# Patient Record
Sex: Male | Born: 1955 | Race: Black or African American | Hispanic: No | Marital: Single | Smoking: Never smoker
Health system: Southern US, Community
[De-identification: ages and names within clinical notes are randomized; demographics above are authoritative.]

## PROBLEM LIST (undated history)

## (undated) DIAGNOSIS — I10 Essential (primary) hypertension: Secondary | ICD-10-CM

## (undated) DIAGNOSIS — M6289 Other specified disorders of muscle: Secondary | ICD-10-CM

## (undated) DIAGNOSIS — F209 Schizophrenia, unspecified: Secondary | ICD-10-CM

## (undated) HISTORY — PX: NO PAST SURGERIES: SHX2092

---

## 2008-12-04 ENCOUNTER — Encounter: Admission: RE | Admit: 2008-12-04 | Discharge: 2008-12-04 | Payer: Self-pay | Admitting: Family Medicine

## 2008-12-04 ENCOUNTER — Encounter (INDEPENDENT_AMBULATORY_CARE_PROVIDER_SITE_OTHER): Payer: Self-pay | Admitting: *Deleted

## 2009-09-01 ENCOUNTER — Encounter: Admission: RE | Admit: 2009-09-01 | Discharge: 2009-09-01 | Payer: Self-pay | Admitting: Family Medicine

## 2010-10-30 ENCOUNTER — Emergency Department (HOSPITAL_COMMUNITY)
Admission: EM | Admit: 2010-10-30 | Discharge: 2010-10-30 | Payer: Self-pay | Source: Home / Self Care | Admitting: Emergency Medicine

## 2010-11-16 ENCOUNTER — Encounter: Payer: Self-pay | Admitting: Gastroenterology

## 2010-12-24 ENCOUNTER — Encounter: Payer: Self-pay | Admitting: Gastroenterology

## 2010-12-24 ENCOUNTER — Ambulatory Visit
Admission: RE | Admit: 2010-12-24 | Discharge: 2010-12-24 | Payer: Self-pay | Source: Home / Self Care | Attending: Gastroenterology | Admitting: Gastroenterology

## 2010-12-24 DIAGNOSIS — I10 Essential (primary) hypertension: Secondary | ICD-10-CM | POA: Insufficient documentation

## 2010-12-24 DIAGNOSIS — R141 Gas pain: Secondary | ICD-10-CM | POA: Insufficient documentation

## 2010-12-24 DIAGNOSIS — R142 Eructation: Secondary | ICD-10-CM

## 2010-12-24 DIAGNOSIS — K219 Gastro-esophageal reflux disease without esophagitis: Secondary | ICD-10-CM | POA: Insufficient documentation

## 2010-12-24 DIAGNOSIS — Z87442 Personal history of urinary calculi: Secondary | ICD-10-CM | POA: Insufficient documentation

## 2010-12-24 DIAGNOSIS — R011 Cardiac murmur, unspecified: Secondary | ICD-10-CM | POA: Insufficient documentation

## 2010-12-24 DIAGNOSIS — K649 Unspecified hemorrhoids: Secondary | ICD-10-CM | POA: Insufficient documentation

## 2010-12-24 DIAGNOSIS — K625 Hemorrhage of anus and rectum: Secondary | ICD-10-CM | POA: Insufficient documentation

## 2010-12-24 DIAGNOSIS — R143 Flatulence: Secondary | ICD-10-CM

## 2010-12-31 ENCOUNTER — Telehealth (INDEPENDENT_AMBULATORY_CARE_PROVIDER_SITE_OTHER): Payer: Self-pay | Admitting: *Deleted

## 2010-12-31 NOTE — Letter (Signed)
Summary: Results Letter  [facility]  [address] [place], [place] [address]   Phone: [phone]  Fax: [phone]        December 24, 2010 MRN: [id number]    [facility] [address] [place], [place]  [address]    Dear Mr. MCENERY,  It is my pleasure to have treated you recently as a new patient in my office. I appreciate your confidence and the opportunity to participate in your care.  Since I do have a busy inpatient endoscopy schedule and office schedule, my office hours vary weekly. I am, however, available for emergency calls everyday through my office. If I am not available for an urgent office appointment, another one of our gastroenterologist will be able to assist you.  My well-trained staff are prepared to help you at all times. For emergencies after office hours, a physician from our Gastroenterology section is always available through my 24 hour answering service  Once again I welcome you as a new patient and I look forward to a happy and healthy relationship             Sincerely,  Louis Meckel MD  This letter has been electronically signed by your physician.  Appended Document: Results Letter letter mailed

## 2010-12-31 NOTE — Letter (Signed)
Summary: [facility] Instructions  [facility]  [address] [place], [place] [address]   Phone: [phone]  Fax: [phone]       Roy Barber    November 08, 1956    MRN: [id number]        Procedure Day /Date:THURSDAY 01/07/2011     Arrival Time:3PM     Procedure Time:4PM     Location of Procedure:                    X  [facility] (4th Floor)                       PREPARATION FOR COLONOSCOPY WITH MOVIPREP   Starting 5 days prior to your procedure2/02/2011 do not eat nuts, seeds, popcorn, corn, beans, peas,  salads, or any raw vegetables.  Do not take any fiber supplements (e.g. Metamucil, Citrucel, and Benefiber).  THE DAY BEFORE YOUR PROCEDURE         DATE: 01/06/2011  DAY: WED  1.  Drink clear liquids the entire day-NO SOLID FOOD  2.  Do not drink anything colored red or purple.  Avoid juices with pulp.  No orange juice.  3.  Drink at least 64 oz. (8 glasses) of fluid/clear liquids during the day to prevent dehydration and help the prep work efficiently.  CLEAR LIQUIDS INCLUDE: Water Jello Ice Popsicles Tea (sugar ok, no milk/cream) Powdered fruit flavored drinks Coffee (sugar ok, no milk/cream) Gatorade Juice: apple, white grape, white cranberry  Lemonade Clear bullion, consomm, broth Carbonated beverages (any kind) Strained chicken noodle soup Hard Candy                             4.  In the morning, mix first dose of MoviPrep solution:    Empty 1 Pouch A and 1 Pouch B into the disposable container    Add lukewarm drinking water to the top line of the container. Mix to dissolve    Refrigerate (mixed solution should be used within 24 hrs)  5.  Begin drinking the prep at 5:00 p.m. The MoviPrep container is divided by 4 marks.   Every 15 minutes drink the solution down to the next mark (approximately 8 oz) until the full liter is complete.   6.  Follow completed prep with 16 oz of clear liquid of your choice (Nothing red or purple).   Continue to drink clear liquids until bedtime.  7.  Before going to bed, mix second dose of MoviPrep solution:    Empty 1 Pouch A and 1 Pouch B into the disposable container    Add lukewarm drinking water to the top line of the container. Mix to dissolve    Refrigerate  THE DAY OF YOUR PROCEDURE      DATE: 01/07/2011 ONG:EXBMWUXL  Beginning at 11a.m. (5 hours before procedure):         1. Every 15 minutes, drink the solution down to the next mark (approx 8 oz) until the full liter is complete.  2. Follow completed prep with 16 oz. of clear liquid of your choice.    3. You may drink clear liquids until 2PM  (2 HOURS BEFORE PROCEDURE).   MEDICATION INSTRUCTIONS  Unless otherwise instructed, you should take regular prescription medications with a small sip of water   as early as possible the morning of your procedure.  OTHER INSTRUCTIONS  You will need a responsible adult at least 56 years of age to accompany you and drive you home.   This person must remain in the waiting room during your procedure.  Wear loose fitting clothing that is easily removed.  Leave jewelry and other valuables at home.  However, you may wish to bring a book to read or  an iPod/MP3 player to listen to music as you wait for your procedure to start.  Remove all body piercing jewelry and leave at home.  Total time from sign-in until discharge is approximately 2-3 hours.  You should go home directly after your procedure and rest.  You can resume normal activities the  day after your procedure.  The day of your procedure you should not:   Drive   Make legal decisions   Operate machinery   Drink alcohol   Return to work  You will receive specific instructions about eating, activities and medications before you leave.    The above instructions have been reviewed and explained to me by   _______________________    I fully understand and can verbalize these instructions  _____________________________ Date _________

## 2010-12-31 NOTE — Letter (Signed)
Summary: New Patient letter  [facility]  [address] [place], [place] [address]   Phone: [phone]  Fax: [phone]       11/16/2010 MRN: [id number]  [facility] [address] [place], [place]  [address]  Dear Mr. LAGRANGE,  Welcome to the Gastroenterology Division at [facility].    You are scheduled to see Dr.  Arlyce Dice on 12-24-10 at 3:45pm on the 3rd floor at [facility], [address]  We ask that you try to arrive at our office 15 minutes prior to your appointment time to allow for check-in.  We would like you to complete the enclosed self-administered evaluation form prior to your visit and bring it with you on the day of your appointment.  We will review it with you.  Also, please bring a complete list of all your medications or, if you prefer, bring the medication bottles and we will list them.  Please bring your insurance card so that we may make a copy of it.  If your insurance requires a referral to see a specialist, please bring your referral form from your primary care physician.  Co-payments are due at the time of your visit and may be paid by cash, check or credit card.     Your office visit will consist of a consult with your physician (includes a physical exam), any laboratory testing he/she may order, scheduling of any necessary diagnostic testing (e.g. x-ray, ultrasound, CT-scan), and scheduling of a procedure (e.g. Endoscopy, Colonoscopy) if required.  Please allow enough time on your schedule to allow for any/all of these possibilities.    If you cannot keep your appointment, please call [phone] to cancel or reschedule prior to your appointment date.  This allows [place] the opportunity to schedule an appointment for another patient in need of care.  If you do not cancel or reschedule by 5 p.m. the business day prior to your appointment date, you will be charged a $50.00 late cancellation/no-show fee.    Thank you for choosing [facility] for your medical needs.  We appreciate the opportunity to care for you.  Please visit [place] at our website  to learn more about our practice.                     Sincerely,                                                             The Gastroenterology Division

## 2011-01-01 ENCOUNTER — Ambulatory Visit (HOSPITAL_COMMUNITY): Payer: MEDICARE | Attending: Gastroenterology

## 2011-01-01 DIAGNOSIS — R011 Cardiac murmur, unspecified: Secondary | ICD-10-CM | POA: Insufficient documentation

## 2011-01-01 DIAGNOSIS — I079 Rheumatic tricuspid valve disease, unspecified: Secondary | ICD-10-CM | POA: Insufficient documentation

## 2011-01-01 DIAGNOSIS — I10 Essential (primary) hypertension: Secondary | ICD-10-CM | POA: Insufficient documentation

## 2011-01-01 DIAGNOSIS — I059 Rheumatic mitral valve disease, unspecified: Secondary | ICD-10-CM | POA: Insufficient documentation

## 2011-01-04 ENCOUNTER — Telehealth: Payer: Self-pay | Admitting: Gastroenterology

## 2011-01-06 NOTE — Progress Notes (Signed)
Summary: Echo appt  Phone Note Outgoing Call Call back at [facility] Phone [phone]   Call placed by: Stanton Kidney, EMT-P,  December 31, 2010 2:07 PM Call placed to: Patient Action Taken: Phone Call Completed Summary of Call: Reminded patient to arrive min. 30 min prior to appt. Stanton Kidney, EMT-P  December 31, 2010 2:08 PM

## 2011-01-06 NOTE — Assessment & Plan Note (Signed)
Summary: BLEEDING HEMORROIDS/YF   History of Present Illness Visit Type: Initial Consult Primary GI MD: Melvia Heaps MD [facility] Primary Provider: Amalia Hailey, MD Requesting Provider: Amalia Hailey, MD Chief Complaint: Pt c/o GERD, bloating, hemorrhoids, and rectal bleeding from hemorrhoids but has slowed down since he used the suppositories History of Present Illness:   Roy Barber is a pleasant 55 year old Afro-American male referred at the request of Dr. Bruna Potter for evaluation of rectal bleeding.  On several occasions he saw small amounts of blood on the toilet tissue.  He denies change in bowel habits, rectal or abdominal pain.  He was treated with suppositories with resolution of bleeding.  He has no other GI complaints.   GI Review of Systems    Reports acid reflux, bloating, and  heartburn.      Denies abdominal pain, belching, chest pain, dysphagia with liquids, dysphagia with solids, loss of appetite, nausea, vomiting, vomiting blood, weight loss, and  weight gain.      Reports hemorrhoids and  rectal bleeding.     Denies anal fissure, black tarry stools, change in bowel habit, constipation, diarrhea, diverticulosis, fecal incontinence, heme positive stool, irritable bowel syndrome, jaundice, light color stool, liver problems, and  rectal pain.    Current Medications (verified): 1)  Cogentin 15mg  .... 1/2 Tablet By Mouth At Bedtime 2)  Clonidine Hcl 0.2 Mg Tabs (Clonidine Hcl) .... One Tablet By Mouth Two Times A Day 3)  Prolixin D 25mg /cc .... Injection Once A Month  Allergies (verified): No Known Drug Allergies  Past History:  Past Medical History: RENAL CALCULUS, HX OF (ICD-V13.01) ABDOMINAL BLOATING (ICD-787.3) GERD (ICD-530.81) HEMORRHOIDS (ICD-455.6) HYPERTENSION (ICD-401.9)  Past Surgical History: Unremarkable  Family History: Family History of Colon Cancer: ? Older Brother  Family History of Colon Polyps: ? Older Brother  Family History of Diabetes: Mother    Family History of Heart Disease: Mother   Social History: [place] [employer] [place] [employer] Single Childern Patient has never smoked.  Alcohol Use - no Daily Caffeine Use: 1 daily  Illicit Drug Use - no Smoking Status:  never Drug Use:  no  Review of Systems  The patient denies allergy/sinus, anemia, anxiety-new, arthritis/joint pain, back pain, blood in urine, breast changes/lumps, change in vision, confusion, cough, coughing up blood, depression-new, fainting, fatigue, fever, headaches-new, hearing problems, heart murmur, heart rhythm changes, itching, menstrual pain, muscle pains/cramps, night sweats, nosebleeds, pregnancy symptoms, shortness of breath, skin rash, sleeping problems, sore throat, swelling of feet/legs, swollen lymph glands, thirst - excessive , urination - excessive , urination changes/pain, urine leakage, vision changes, and voice change.    Vital Signs:  Patient profile:   55 year old male Height:      74 inches Weight:      225 pounds BMI:     28.99 BSA:     2.29 Pulse rate:   88 / minute Pulse rhythm:   regular BP sitting:   142 / 86  (left arm) Cuff size:   regular  Vitals Entered By: Ok Anis CMA (December 24, 2010 3:04 PM)  Physical Exam  Additional Exam:  On physical exam he is a well-developed well-nourished male  skin: anicteric HEENT: normocephalic; PEERLA; no nasal or pharyngeal abnormalities neck: supple nodes: no cervical lymphadenopathy chest: clear to ausculatation and percussion heart: no  gallops, or rubs; there is a 2-3/6 early systolic murmur heard bestalong  the left sternal border abd: soft, nontender; BS normoactive; no abdominal masses, tenderness, organomegaly rectal: no masses  ext: no cynanosis, clubbing, edema skeletal: no deformities neuro: oriented x 3; no focal abnormalities    Impression & Recommendations:  Problem # 1:  HEMORRHAGE OF RECTUM AND ANUS (ICD-569.3)  Limited rectal bleeding is most likely due to  hemorrhoids.  A more proximal colonic bleeding source should be ruled out.  Recommendations #1 colonoscopy  Risks, alternatives, and complications of the procedure, including bleeding, perforation, and possible need for surgery, were explained to the patient.  Patient's questions were answered.  Orders: Colonoscopy (Colon)  Problem # 2:  CARDIAC MURMUR, SYSTOLIC (ICD-785.2) Plan echocardiogram  Patient Instructions: 1)  Copy sent to : Amalia Hailey, MD 2)  Your Colonoscopy is scheduled on 01/07/2011 at 4pm 3)  You can pick up your MoviPrep today 4)  Your Echocardiogram is scheduled for 01/01/2011 at 7:30am at [facility] 5)  Colonoscopy and Flexible Sigmoidoscopy brochure given.  6)  Conscious Sedation brochure given.  7)  The medication list was reviewed and reconciled.  All changed / newly prescribed medications were explained.  A complete medication list was provided to the patient / caregiver. Prescriptions: MOVIPREP 100 GM  SOLR (PEG-KCL-NACL-NASULF-NA ASC-C) As per prep instructions.  #1 x 0   Entered by:   Merri Ray CMA (AAMA)   Authorized by:   Louis Meckel MD   Signed by:   Merri Ray CMA (AAMA) on 12/24/2010   Method used:   Electronically to        [facility] (retail)       [address]       [place], [place]  [address]       Ph: [phone]       Fax: [phone]   RxID:   [id number]

## 2011-01-07 ENCOUNTER — Other Ambulatory Visit: Payer: MEDICARE | Admitting: Gastroenterology

## 2011-01-07 ENCOUNTER — Encounter: Payer: Self-pay | Admitting: Gastroenterology

## 2011-01-07 ENCOUNTER — Other Ambulatory Visit: Payer: Self-pay | Admitting: Gastroenterology

## 2011-01-07 ENCOUNTER — Other Ambulatory Visit (AMBULATORY_SURGERY_CENTER): Payer: MEDICARE | Admitting: Gastroenterology

## 2011-01-07 DIAGNOSIS — K5730 Diverticulosis of large intestine without perforation or abscess without bleeding: Secondary | ICD-10-CM

## 2011-01-07 DIAGNOSIS — D126 Benign neoplasm of colon, unspecified: Secondary | ICD-10-CM

## 2011-01-07 DIAGNOSIS — K648 Other hemorrhoids: Secondary | ICD-10-CM

## 2011-01-07 DIAGNOSIS — K625 Hemorrhage of anus and rectum: Secondary | ICD-10-CM

## 2011-01-12 ENCOUNTER — Encounter: Payer: Self-pay | Admitting: Gastroenterology

## 2011-01-14 NOTE — Procedures (Addendum)
Summary: Colonoscopy  Patient: Roy Barber Note: All result statuses are Final unless otherwise noted.  Tests: (1) Colonoscopy (COL)   COL Colonoscopy           DONE     [facility]     [address]     [place], [place]  [address]           COLONOSCOPY PROCEDURE REPORT           PATIENT:  Roy, Barber  MR#:  [id number]     BIRTHDATE:  1956/04/24, 54 yrs. old  GENDER:  male           ENDOSCOPIST:  Barbette Hair. Arlyce Dice, MD     Referred by:  Clyda Greener, M.D.           PROCEDURE DATE:  01/07/2011     PROCEDURE:  Colonoscopy with snare polypectomy     ASA CLASS:  Class I     INDICATIONS:  1) rectal bleeding           MEDICATIONS:   Fentanyl 100 mcg IV, Versed 10 mg IV           DESCRIPTION OF PROCEDURE:   After the risks benefits and     alternatives of the procedure were thoroughly explained, informed     consent was obtained.  Digital rectal exam was performed and     revealed no abnormalities.   The LB 180AL [id number] endoscope was     introduced through the anus and advanced to the cecum, which was     identified by both the appendix and ileocecal valve, without     limitations.  The quality of the prep was excellent, using     MoviPrep.  The instrument was then slowly withdrawn as the colon     was fully examined.     <<PROCEDUREIMAGES>>           FINDINGS:  A sessile polyp was found in the descending colon. It     was 3 mm in size. Polyp was snared without cautery. Retrieval was     successful (see image8). snare polyp  Scattered diverticula were     found in the ascending colon (see image1).  Internal Hemorrhoids     were found (see image16).  This was otherwise a normal examination     of the colon (see image2, image4, image5, and image15).     Retroflexed views in the rectum revealed no abnormalities.    The     time to cecum =  2.0  minutes. The scope was then withdrawn (time     =  8.25  min) from the patient and the procedure completed.        COMPLICATIONS:  None           ENDOSCOPIC IMPRESSION:     1) 3 mm sessile polyp in the descending colon     2) Diverticula, scattered in the ascending colon     3) Internal hemorrhoids     4) Otherwise normal examination           Limited rectal bleeding secondary to hemorrhoids           RECOMMENDATIONS:     1) Anusol HC supp. prn     2) If the polyp(s) removed today are proven to be adenomatous     (pre-cancerous) polyps, you will need a repeat colonoscopy in 5     years. Otherwise you should continue  to follow colorectal cancer     screening guidelines for "routine risk" patients with colonoscopy     in 10 years.           REPEAT EXAM:   You will receive a letter from Dr. Arlyce Dice in 1-2     weeks, after reviewing the final pathology, with followup     recommendations.           ______________________________     Barbette Hair Arlyce Dice, MD           CC:           n.     eSIGNED:   Barbette Hair. Haasini Patnaude at 01/07/2011 03:34 PM           Phineas Inches, [phone]  Note: An exclamation mark (!) indicates a result that was not dispersed into the flowsheet. Document Creation Date: 01/07/2011 3:35 PM _______________________________________________________________________  (1) Order result status: Final Collection or observation date-time: 01/07/2011 15:26 Requested date-time:  Receipt date-time:  Reported date-time:  Referring Physician:   Ordering Physician: Melvia Heaps [phone]) Specimen Source:  Source: Launa Grill Order Number: [id number] Lab site:   Appended Document: Colonoscopy     Procedures Next Due Date:    Colonoscopy: 12/2015

## 2011-01-14 NOTE — Miscellaneous (Signed)
Summary: rx for anusol  Clinical Lists Changes  Medications: Added new medication of ANUSOL-HC 25 MG SUPP (HYDROCORTISONE ACETATE) As needed at bedtime for hemorroidal pain - Signed Rx of ANUSOL-HC 25 MG SUPP (HYDROCORTISONE ACETATE) As needed at bedtime for hemorroidal pain;  #12 x 0;  Signed;  Entered by: Weston Brass RN;  Authorized by: Louis Meckel MD;  Method used: Electronically to [facility] [address] [place], [place], [place]  [address], Ph: [phone], Fax: [phone]    Prescriptions: ANUSOL-HC 25 MG SUPP (HYDROCORTISONE ACETATE) As needed at bedtime for hemorroidal pain  #12 x 0   Entered by:   Weston Brass RN   Authorized by:   Louis Meckel MD   Signed by:   Weston Brass RN on 01/07/2011   Method used:   Electronically to        [facility] (retail)       [address]       [place], [place]  [address]       Ph: [phone]       Fax: [phone]   RxID:   [id number]

## 2011-01-14 NOTE — Progress Notes (Signed)
Summary: Results  Phone Note Call from Patient Call back at Home Phone [phone]   Caller: Patient Call For: Dr. Arlyce Dice Reason for Call: Talk to Nurse Summary of Call: Pt is calling to check on results of procedure he had Initial call taken by: [place] Johnson,  January 04, 2011 12:25 PM  Follow-up for Phone Call        Per Dr. Arlyce Dice the 2D Echo results showed no obvious abnormalities. Patient aware. Follow-up by: Selinda Michaels RN,  January 04, 2011 12:52 PM

## 2011-01-20 NOTE — Letter (Signed)
Summary: Patient Notice- Polyp Results  [facility]  [address] [place], [place] [address]   Phone: [phone]  Fax: [phone]        January 12, 2011 MRN: [id number]    Roy Barber [address] [place], [place]  [address]    Dear [name],  I am pleased to inform you that the colon polyp(s) removed during your recent colonoscopy was (were) found to be benign (no cancer detected) upon pathologic examination.  I recommend you have a repeat colonoscopy examination in 5_ years to look for recurrent polyps, as having colon polyps increases your risk for having recurrent polyps or even colon cancer in the future.  Should you develop new or worsening symptoms of abdominal pain, bowel habit changes or bleeding from the rectum or bowels, please schedule an evaluation with either your primary care physician or with me.  Additional information/recommendations:  __ No further action with gastroenterology is needed at this time. Please      follow-up with your primary care physician for your other healthcare      needs.  __ Please call [phone] to schedule a return visit to review your      situation.  __ Please keep your follow-up visit as already scheduled.  _x_ Continue treatment plan as outlined the day of your exam.  Please call us if you are having persistent problems or have questions about your condition that have not been fully answered at this time.  Sincerely,  Louis Meckel MD  This letter has been electronically signed by your physician.  Appended Document: Patient Notice- Polyp Results letter mailed

## 2011-02-09 LAB — POCT I-STAT, CHEM 8
Chloride: 107 mEq/L (ref 96–112)
Creatinine, Ser: 1.1 mg/dL (ref 0.4–1.5)
Glucose, Bld: 122 mg/dL — ABNORMAL HIGH (ref 70–99)
Sodium: 142 mEq/L (ref 135–145)
TCO2: 27 mmol/L (ref 0–100)

## 2012-06-13 ENCOUNTER — Emergency Department (HOSPITAL_COMMUNITY)
Admission: EM | Admit: 2012-06-13 | Discharge: 2012-06-13 | Disposition: A | Discharge status: Home / Self Care | Payer: PRIVATE HEALTH INSURANCE | Attending: Emergency Medicine | Admitting: Emergency Medicine

## 2012-06-13 ENCOUNTER — Emergency Department (HOSPITAL_COMMUNITY): Payer: PRIVATE HEALTH INSURANCE

## 2012-06-13 ENCOUNTER — Encounter (HOSPITAL_COMMUNITY): Payer: Self-pay

## 2012-06-13 DIAGNOSIS — I10 Essential (primary) hypertension: Secondary | ICD-10-CM | POA: Insufficient documentation

## 2012-06-13 DIAGNOSIS — S39011A Strain of muscle, fascia and tendon of abdomen, initial encounter: Secondary | ICD-10-CM

## 2012-06-13 DIAGNOSIS — R109 Unspecified abdominal pain: Secondary | ICD-10-CM

## 2012-06-13 DIAGNOSIS — IMO0002 Reserved for concepts with insufficient information to code with codable children: Secondary | ICD-10-CM | POA: Insufficient documentation

## 2012-06-13 DIAGNOSIS — X58XXXA Exposure to other specified factors, initial encounter: Secondary | ICD-10-CM | POA: Insufficient documentation

## 2012-06-13 HISTORY — DX: Other specified disorders of muscle: M62.89

## 2012-06-13 HISTORY — DX: Essential (primary) hypertension: I10

## 2012-06-13 LAB — URINALYSIS, ROUTINE W REFLEX MICROSCOPIC
Hgb urine dipstick: NEGATIVE
Leukocytes, UA: NEGATIVE
Protein, ur: 30 mg/dL — AB
Specific Gravity, Urine: 1.017 (ref 1.005–1.030)
Urobilinogen, UA: 1 mg/dL (ref 0.0–1.0)

## 2012-06-13 LAB — COMPREHENSIVE METABOLIC PANEL
ALT: 22 U/L (ref 0–53)
AST: 27 U/L (ref 0–37)
Chloride: 102 mEq/L (ref 96–112)
GFR calc Af Amer: 88 mL/min — ABNORMAL LOW (ref >90)
Potassium: 3.9 mEq/L (ref 3.5–5.1)
Sodium: 141 mEq/L (ref 135–145)
Total Protein: 8.2 g/dL (ref 6.0–8.3)

## 2012-06-13 LAB — CBC WITH DIFFERENTIAL/PLATELET
Eosinophils Absolute: 0.2 10*3/uL (ref 0.0–0.7)
MCH: 28.9 pg (ref 26.0–34.0)
MCHC: 34.1 g/dL (ref 30.0–36.0)
MCV: 84.8 fL (ref 78.0–100.0)
Monocytes Relative: 9 % (ref 3–12)
RBC: 4.88 MIL/uL (ref 4.22–5.81)
RDW: 13.6 % (ref 11.5–15.5)
WBC: 6 10*3/uL (ref 4.0–10.5)

## 2012-06-13 LAB — URINE MICROSCOPIC-ADD ON

## 2012-06-13 MED ORDER — SODIUM CHLORIDE 0.9 % IV BOLUS (SEPSIS)
1000.0000 mL | Freq: Once | INTRAVENOUS | Status: AC
  Administered 2012-06-13: 1000 mL via INTRAVENOUS

## 2012-06-13 MED ORDER — KETOROLAC TROMETHAMINE 30 MG/ML IJ SOLN
30.0000 mg | Freq: Once | INTRAMUSCULAR | Status: AC
  Administered 2012-06-13: 30 mg via INTRAVENOUS
  Filled 2012-06-13: qty 1

## 2012-06-13 NOTE — ED Notes (Signed)
Pt has had right sided abdominal pain that radiates to his right flank x1 week. Pt denies SOB, nausea,vomitng or diarrhea.

## 2012-06-13 NOTE — ED Notes (Signed)
Please call pt sister, jennifer on [place], [phone]

## 2012-06-13 NOTE — ED Provider Notes (Signed)
History     CSN: [id number]  Arrival date & time 06/13/12  1652   First MD Initiated Contact with Patient 06/13/12 1937      Chief Complaint  Patient presents with  . Right sided pain     (Consider location/radiation/quality/duration/timing/severity/associated sxs/prior treatment) Patient is a 56 y.o. male presenting with flank pain. The history is provided by the patient.  Flank Pain This is a new problem. The current episode started in the past 7 days. The problem occurs constantly. The problem has been waxing and waning. Associated symptoms include abdominal pain. Pertinent negatives include no arthralgias, change in bowel habit, chest pain, chills, coughing, fatigue, fever, headaches, myalgias, nausea, rash, vomiting or weakness. The symptoms are aggravated by twisting (sitting up). He has tried acetaminophen for the symptoms. The treatment provided mild relief.    Past Medical History  Diagnosis Date  . Hypertension   . Muscle stiffness     No past surgical history on file.  History reviewed. No pertinent family history.  History  Substance Use Topics  . Smoking status: Never Smoker   . Smokeless tobacco: Not on file  . Alcohol Use: No      Review of Systems  Unable to perform ROS Constitutional: Negative for fever, chills and fatigue.  Respiratory: Negative for cough and shortness of breath.   Cardiovascular: Negative for chest pain and palpitations.  Gastrointestinal: Positive for abdominal pain. Negative for nausea, vomiting, diarrhea, constipation, abdominal distention and change in bowel habit.  Genitourinary: Positive for flank pain. Negative for dysuria, hematuria and difficulty urinating.  Musculoskeletal: Negative for myalgias and arthralgias.  Skin: Negative for color change and rash.  Neurological: Negative for weakness, light-headedness and headaches.  All other systems reviewed and are negative.    Allergies  Review of patient's allergies  indicates no known allergies.  Home Medications  No current outpatient prescriptions on file.  BP 190/91  Pulse 92  Temp 97.7 F (36.5 C)  Resp 18  SpO2 100%  Physical Exam  Nursing note and vitals reviewed. Constitutional: He is oriented to person, place, and time. He appears well-developed and well-nourished.  HENT:  Head: Normocephalic and atraumatic.  Eyes: EOM are normal. Pupils are equal, round, and reactive to light.  Cardiovascular: Normal rate and regular rhythm.   Pulmonary/Chest: Effort normal and breath sounds normal. No respiratory distress.  Abdominal: Soft. Bowel sounds are normal. He exhibits no distension. There is tenderness. There is no rebound and no guarding. No hernia.    Neurological: He is alert and oriented to person, place, and time.  Skin: Skin is warm and dry.  Psychiatric: He has a normal mood and affect.    ED Course  Procedures (including critical care time)  Labs Reviewed  COMPREHENSIVE METABOLIC PANEL - Abnormal; Notable for the following:    Glucose, Bld 106 (*)     GFR calc non Af Amer 76 (*)     GFR calc Af Amer 88 (*)     All other components within normal limits  URINALYSIS, ROUTINE W REFLEX MICROSCOPIC - Abnormal; Notable for the following:    Protein, ur 30 (*)     All other components within normal limits  CBC WITH DIFFERENTIAL  URINE MICROSCOPIC-ADD ON   Ct Abdomen Pelvis Wo Contrast  06/13/2012  *RADIOLOGY REPORT*  Clinical Data: Right-sided abdominal/flank pain for past week.  CT ABDOMEN AND PELVIS WITHOUT CONTRAST  Technique:  Multidetector CT imaging of the abdomen and pelvis was performed following the  standard protocol without intravenous contrast.  Comparison: 12/04/2008  Findings: No renal or ureteral calculi or findings to suggest renal collecting system obstruction.   Slightly lobulated contour of the right kidney which contains a 1 cm low density structure inferiorly which was noted previously and too small to adequately  characterize.  No extraluminal bowel inflammatory process, free fluid or free air. Specifically, no inflammation surrounds the appendix.  Fatty liver.  Evaluation of solid abdominal viscera is limited by lack of IV contrast.  Taking this limitation into account no focal hepatic, splenic, pancreatic or adrenal mass.  No calcified gallstones.  Coronary artery calcifications may be present.  There are atherosclerotic type changes with calcification involving the aorta and iliac arteries.  No abdominal aortic aneurysm.  No bony destructive lesion.  Mild degenerative changes lumbar spine.  No adenopathy.  No obvious bladder mass detected on this unenhanced exam.  Prostate gland slightly prominent.  Clinical correlation recommended.  IMPRESSION: No renal or ureteral calculi or findings to suggest renal collecting system obstruction.   Slightly lobulated contour of the right kidney which contains a 1 cm low density structure inferiorly which was noted previously and too small to adequately characterize.  No extraluminal bowel inflammatory process, free fluid or free air. Specifically, no inflammation surrounds the appendix.  Fatty liver.  Coronary artery calcifications may be present.  There are atherosclerotic type changes with calcification involving the aorta and iliac arteries.  No abdominal aortic aneurysm.  Prostate gland slightly prominent.  Clinical correlation recommended.  Original Report Authenticated By: Fuller Canada, M.D.     1. Right sided abdominal pain   2. Strain of abdominal muscle       MDM  This is a 56 year old man who presents today with right-sided abdominal pain for a week. He states he first noticed it when he was sitting up to get out bed, and has been feeling nearly continuously since then. He states it waxes and wanes in intensity, and he did have some relief with Tylenol. He says it hurts worse when he is going from a laying to seated position, but when he is at rest, walking  around or in the seated position it hurts less. He denies any fevers, nausea, vomiting, changes in bowels, change in urine, or any other complaints. He states that he has previously had kidney stones that he had to have removed, but the pain felt nothing like this. He has not previously had an abdominal surgeries. On exam he does have some mild tenderness to the right side of his abdomen, and the pain gets worse with activation his abdominal muscles. The patient does note that he does a lot of heavy lifting at work, and shortly before discharge was at a reunion where he was carrying around a heavy cooler full of drinks. Patient's symptoms are most likely due to musculoskeletal pain, however given his history of kidney stones requiring intervention the location of the pain, we'll get a CT scan to evaluate for possible stone.  CT scan was negative. Shows moderate improvement of his pain at this point in time. Discussed with the patient treatment at home, followup with regular Dr., indications for return. The patient expressed understanding with this plan.        Theotis Burrow, MD 06/14/12 [phone]

## 2012-06-16 NOTE — ED Provider Notes (Signed)
I saw and evaluated the patient, reviewed the resident's note and I agree with the findings and plan. Pt with right flank/rlq pain. Tenderness on exam. Ct.   Suzi Roots, MD 06/16/12 [phone]

## 2013-10-12 ENCOUNTER — Encounter (HOSPITAL_COMMUNITY): Payer: Self-pay | Admitting: Emergency Medicine

## 2013-10-12 ENCOUNTER — Emergency Department (HOSPITAL_COMMUNITY)
Admission: EM | Admit: 2013-10-12 | Discharge: 2013-10-12 | Disposition: A | Discharge status: Home / Self Care | Payer: PRIVATE HEALTH INSURANCE | Attending: Emergency Medicine | Admitting: Emergency Medicine

## 2013-10-12 ENCOUNTER — Other Ambulatory Visit: Payer: Self-pay

## 2013-10-12 ENCOUNTER — Emergency Department (HOSPITAL_COMMUNITY): Payer: PRIVATE HEALTH INSURANCE

## 2013-10-12 DIAGNOSIS — I10 Essential (primary) hypertension: Secondary | ICD-10-CM | POA: Insufficient documentation

## 2013-10-12 DIAGNOSIS — R109 Unspecified abdominal pain: Secondary | ICD-10-CM

## 2013-10-12 DIAGNOSIS — R110 Nausea: Secondary | ICD-10-CM | POA: Insufficient documentation

## 2013-10-12 DIAGNOSIS — R1013 Epigastric pain: Secondary | ICD-10-CM | POA: Insufficient documentation

## 2013-10-12 DIAGNOSIS — R197 Diarrhea, unspecified: Secondary | ICD-10-CM | POA: Insufficient documentation

## 2013-10-12 DIAGNOSIS — R079 Chest pain, unspecified: Secondary | ICD-10-CM | POA: Insufficient documentation

## 2013-10-12 DIAGNOSIS — Z79899 Other long term (current) drug therapy: Secondary | ICD-10-CM | POA: Insufficient documentation

## 2013-10-12 DIAGNOSIS — F209 Schizophrenia, unspecified: Secondary | ICD-10-CM | POA: Insufficient documentation

## 2013-10-12 HISTORY — DX: Schizophrenia, unspecified: F20.9

## 2013-10-12 LAB — CBC
Hemoglobin: 14.2 g/dL (ref 13.0–17.0)
MCH: 29.6 pg (ref 26.0–34.0)
MCHC: 35 g/dL (ref 30.0–36.0)
MCV: 84.6 fL (ref 78.0–100.0)
Platelets: 327 10*3/uL (ref 150–400)
RDW: 13.3 % (ref 11.5–15.5)
WBC: 8 10*3/uL (ref 4.0–10.5)

## 2013-10-12 LAB — POCT I-STAT TROPONIN I: Troponin i, poc: 0 ng/mL (ref 0.00–0.08)

## 2013-10-12 LAB — LIPASE, BLOOD: Lipase: 46 U/L (ref 11–59)

## 2013-10-12 LAB — BASIC METABOLIC PANEL
Calcium: 10 mg/dL (ref 8.4–10.5)
Chloride: 103 mEq/L (ref 96–112)
Creatinine, Ser: 1.21 mg/dL (ref 0.50–1.35)
Glucose, Bld: 107 mg/dL — ABNORMAL HIGH (ref 70–99)
Potassium: 3.6 mEq/L (ref 3.5–5.1)

## 2013-10-12 MED ORDER — OMEPRAZOLE 20 MG PO CPDR: 20.0000 mg | DELAYED_RELEASE_CAPSULE | Freq: Every day | ORAL | Status: DC

## 2013-10-12 NOTE — ED Notes (Signed)
Patient continuously coming up to nursing station asking about discharge status. Informed patient that we are still waiting for Troponin result and asked patient to please wait in his room, and I would bring discharge form when dispo is set. Patient understanding, and returned to room.

## 2013-10-12 NOTE — ED Provider Notes (Signed)
CSN: [id number]     Arrival date & time 10/12/13  1850 History   First MD Initiated Contact with Patient 10/12/13 1900     Chief Complaint  Patient presents with  . Chest Pain  . Abdominal Pain   (Consider location/radiation/quality/duration/timing/severity/associated sxs/prior Treatment) HPI Patient presents with epigastric pain, nausea. Symptoms began in the hours prior to arrival, but have stopped, spontaneously, prior to my evaluation. Patient was in his usual state of health prior to the onset of symptoms.  Also is gradual.  Pain is focally about the epigastrium, nonradiating, sore there was associated nausea, no vomiting. No recent incontinence. After the onset of symptoms the patient had one episode of diarrhea. No fever, no dyspnea, no cough. Patient denies cardiac history, notes psychiatric issues.  Past Medical History  Diagnosis Date  . Hypertension   . Muscle stiffness   . Schizophrenia    History reviewed. No pertinent past surgical history. No family history on file. History  Substance Use Topics  . Smoking status: Never Smoker   . Smokeless tobacco: Not on file  . Alcohol Use: No    Review of Systems  Constitutional:       Per HPI, otherwise negative  HENT:       Per HPI, otherwise negative  Respiratory:       Per HPI, otherwise negative  Cardiovascular:       Per HPI, otherwise negative  Gastrointestinal: Positive for nausea, abdominal pain and diarrhea. Negative for vomiting.  Endocrine:       Negative aside from HPI  Genitourinary:       Neg aside from HPI   Musculoskeletal:       Per HPI, otherwise negative  Skin: Negative.   Neurological: Negative for syncope.    Allergies  Review of patient's allergies indicates no known allergies.  Home Medications   Current Outpatient Rx  Name  Route  Sig  Dispense  Refill  . benztropine (COGENTIN) 0.5 MG tablet   Oral   Take 0.5 mg by mouth every evening.         . fluPHENAZine (PROLIXIN) 1 MG  tablet   Oral   Take 3 mg by mouth at bedtime.          BP 218/101  Pulse 107  Temp(Src) 98.2 F (36.8 C) (Oral)  Resp 16  Ht 6\' 2"  (1.88 m)  Wt 220 lb (99.791 kg)  BMI 28.23 kg/m2  SpO2 100% Physical Exam  Nursing note and vitals reviewed. Constitutional: He is oriented to person, place, and time. He appears well-developed. No distress.  HENT:  Head: Normocephalic and atraumatic.  Eyes: Conjunctivae and EOM are normal.  Cardiovascular: Normal rate and regular rhythm.   Pulmonary/Chest: Effort normal. No stridor. No respiratory distress.  Abdominal: He exhibits no distension. There is no tenderness.  Musculoskeletal: He exhibits no edema.  Neurological: He is alert and oriented to person, place, and time.  Skin: Skin is warm and dry.  Psychiatric: He has a normal mood and affect.    ED Course  Procedures (including critical care time) Labs Review Labs Reviewed  CBC  BASIC METABOLIC PANEL  LIPASE, BLOOD  POCT I-STAT TROPONIN I   Imaging Review No results found.  EKG Interpretation   None      Cardiac 95 Central normal Pulse ox which 100% remainder normal  EKG has sinus tachycardia, rate 109, T wave inversions laterally, abnormal   8:31 PM Patient ambulatory, in no distress.  He denies  no complaints.  Repeat troponin will be sent  10:41 PM Patient ambulatory, with no complaints, and requesting discharge.   Update: Second troponin is negative. MDM  No diagnosis found. This patient presents after an episode of epigastric discomfort.  Patient has multiple psychiatric issues, as well as hypertension, but no documented cardiac history.  Patient is notably hypertensive on arrival, but had no active complaints.  Patient's blood pressure improved while here, and he had no new complaints throughout.  With serial negative troponins, nonischemic EKG, and his denial of any ongoing complaints, there is low suspicion for ongoing coronary ischemia.    Gerhard Munch, MD 10/12/13 [phone]

## 2013-10-12 NOTE — ED Notes (Addendum)
Pt is c/o of epigastric and chest pain ever since the nurse at [facility] gave him the pills instead of giving him the shot like she normally does.  BP 218/101.

## 2014-06-21 IMAGING — CR DG CHEST 2V
3 series · 3 of 3 positions shown · non-contrast
Comparison: 09/01/2009

CLINICAL DATA: Chest pain.  Hypertension.

EXAM:
CHEST  2 VIEW

[w chest pa]
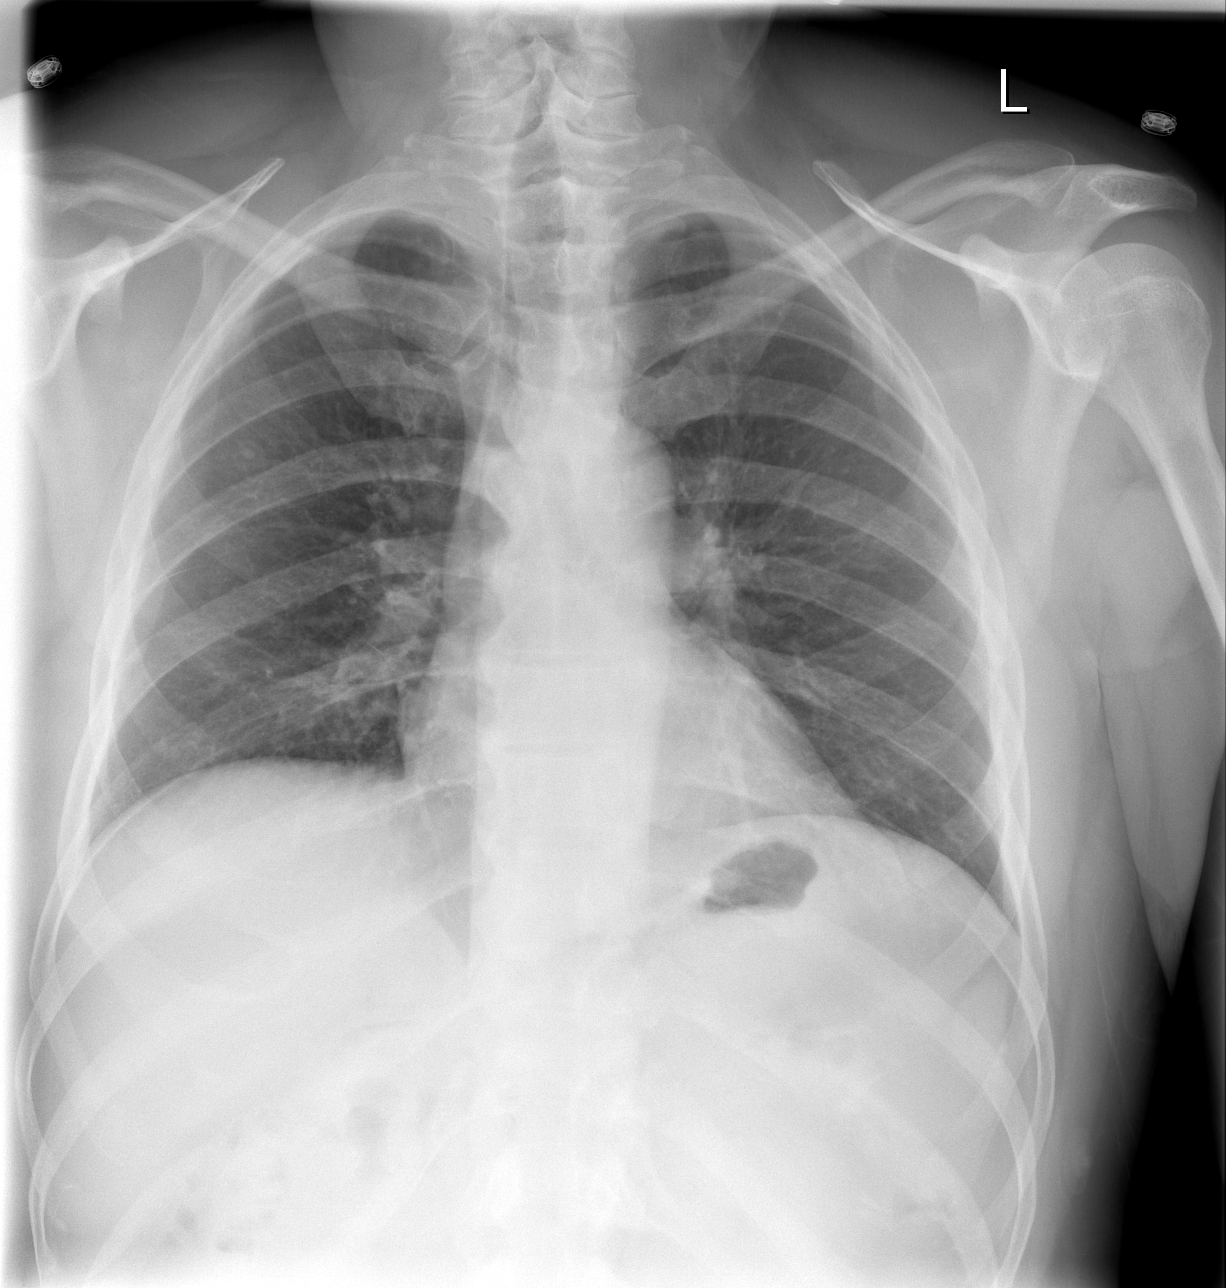

[w chest lat (1 of 2)]
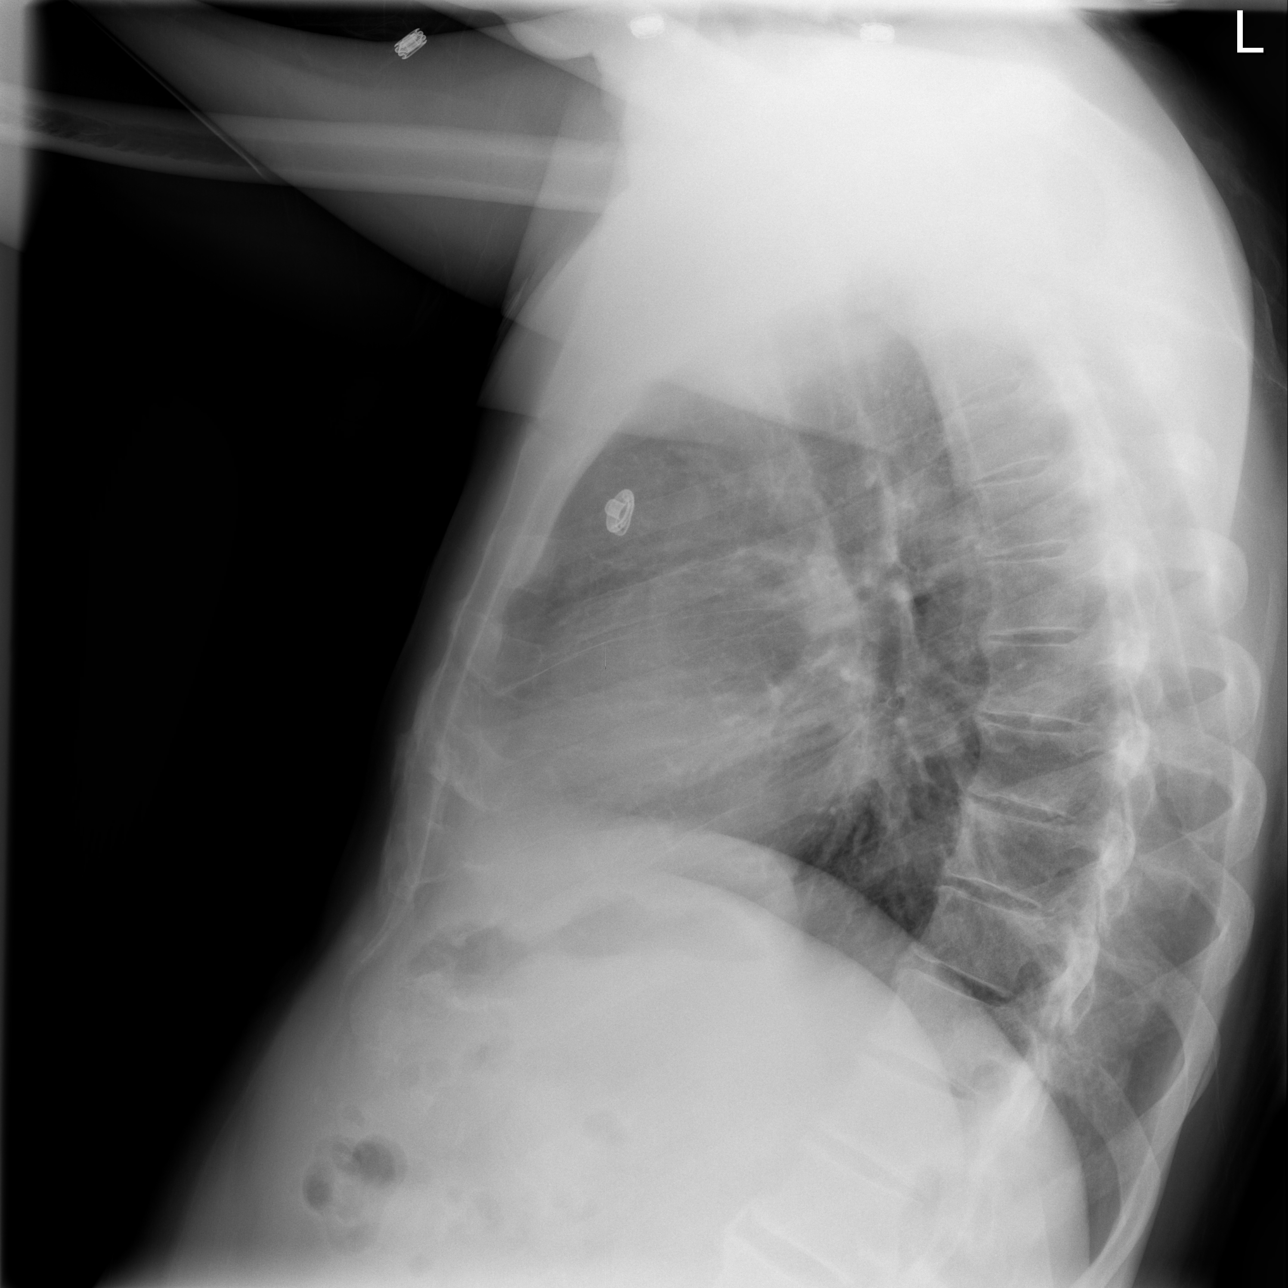

[w chest lat (2 of 2)]
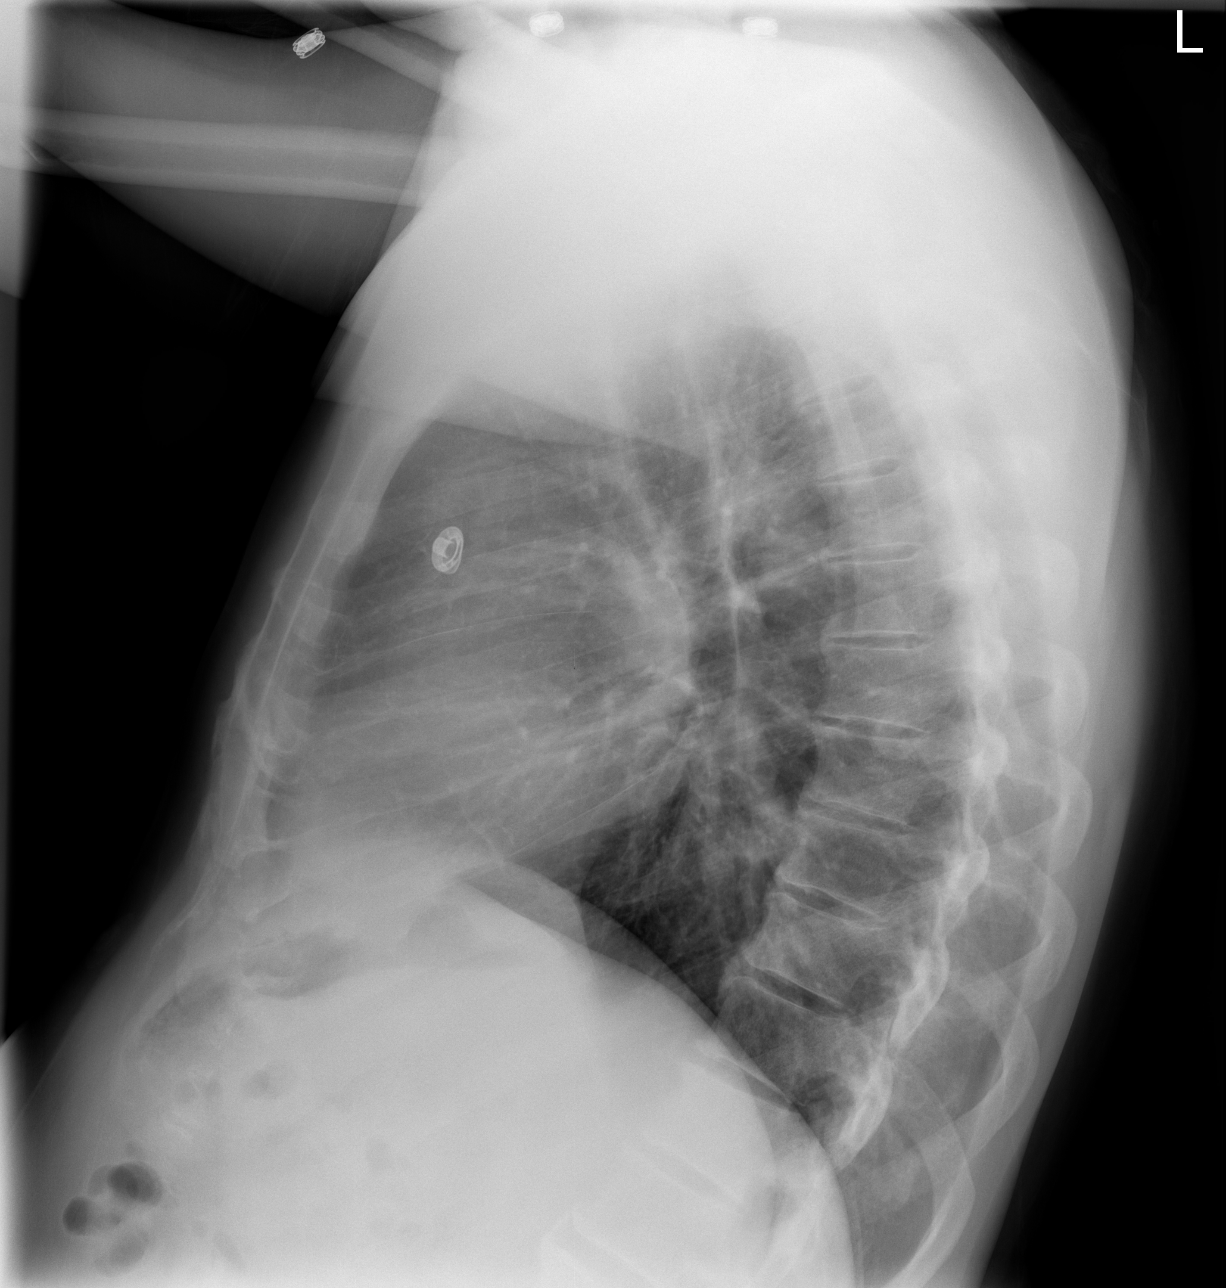

[3 of 3 positions shown; findings below may reference images not displayed]

FINDINGS: The heart size and mediastinal contours are within normal limits.
Both lungs are clear. The visualized skeletal structures are
unremarkable.
IMPRESSION: No active cardiopulmonary disease.

## 2014-07-24 ENCOUNTER — Observation Stay (HOSPITAL_COMMUNITY)
Admission: EM | Admit: 2014-07-24 | Discharge: 2014-07-25 | Disposition: A | Discharge status: Home / Self Care | Payer: PRIVATE HEALTH INSURANCE | Attending: Internal Medicine | Admitting: Internal Medicine

## 2014-07-24 ENCOUNTER — Emergency Department (HOSPITAL_COMMUNITY): Payer: PRIVATE HEALTH INSURANCE

## 2014-07-24 ENCOUNTER — Encounter (HOSPITAL_COMMUNITY): Payer: Self-pay | Admitting: Emergency Medicine

## 2014-07-24 DIAGNOSIS — N179 Acute kidney failure, unspecified: Secondary | ICD-10-CM | POA: Diagnosis not present

## 2014-07-24 DIAGNOSIS — I129 Hypertensive chronic kidney disease with stage 1 through stage 4 chronic kidney disease, or unspecified chronic kidney disease: Secondary | ICD-10-CM | POA: Insufficient documentation

## 2014-07-24 DIAGNOSIS — I951 Orthostatic hypotension: Secondary | ICD-10-CM | POA: Diagnosis not present

## 2014-07-24 DIAGNOSIS — R55 Syncope and collapse: Secondary | ICD-10-CM | POA: Diagnosis not present

## 2014-07-24 DIAGNOSIS — R9431 Abnormal electrocardiogram [ECG] [EKG]: Secondary | ICD-10-CM | POA: Diagnosis not present

## 2014-07-24 DIAGNOSIS — F209 Schizophrenia, unspecified: Secondary | ICD-10-CM | POA: Diagnosis not present

## 2014-07-24 DIAGNOSIS — Z79899 Other long term (current) drug therapy: Secondary | ICD-10-CM | POA: Insufficient documentation

## 2014-07-24 DIAGNOSIS — E871 Hypo-osmolality and hyponatremia: Secondary | ICD-10-CM | POA: Diagnosis not present

## 2014-07-24 DIAGNOSIS — N182 Chronic kidney disease, stage 2 (mild): Secondary | ICD-10-CM | POA: Insufficient documentation

## 2014-07-24 DIAGNOSIS — N189 Chronic kidney disease, unspecified: Secondary | ICD-10-CM

## 2014-07-24 LAB — URINALYSIS, ROUTINE W REFLEX MICROSCOPIC
BILIRUBIN URINE: NEGATIVE
Glucose, UA: NEGATIVE mg/dL
HGB URINE DIPSTICK: NEGATIVE
Ketones, ur: 15 mg/dL — AB
Leukocytes, UA: NEGATIVE
Nitrite: NEGATIVE
PH: 5.5 (ref 5.0–8.0)
Protein, ur: NEGATIVE mg/dL
SPECIFIC GRAVITY, URINE: 1.01 (ref 1.005–1.030)
Urobilinogen, UA: 1 mg/dL (ref 0.0–1.0)

## 2014-07-24 LAB — RAPID URINE DRUG SCREEN, HOSP PERFORMED
Amphetamines: NOT DETECTED
Barbiturates: NOT DETECTED
Benzodiazepines: NOT DETECTED
COCAINE: NOT DETECTED
OPIATES: NOT DETECTED
TETRAHYDROCANNABINOL: NOT DETECTED

## 2014-07-24 LAB — CBC WITH DIFFERENTIAL/PLATELET
BASOS PCT: 1 % (ref 0–1)
Basophils Absolute: 0.1 10*3/uL (ref 0.0–0.1)
Eosinophils Absolute: 0.2 10*3/uL (ref 0.0–0.7)
Eosinophils Relative: 3 % (ref 0–5)
HCT: 42.6 % (ref 39.0–52.0)
Hemoglobin: 14.5 g/dL (ref 13.0–17.0)
Lymphocytes Relative: 31 % (ref 12–46)
Lymphs Abs: 2.2 10*3/uL (ref 0.7–4.0)
MCH: 28.8 pg (ref 26.0–34.0)
MCHC: 34 g/dL (ref 30.0–36.0)
MCV: 84.5 fL (ref 78.0–100.0)
Monocytes Absolute: 0.6 10*3/uL (ref 0.1–1.0)
Monocytes Relative: 8 % (ref 3–12)
NEUTROS ABS: 3.9 10*3/uL (ref 1.7–7.7)
NEUTROS PCT: 57 % (ref 43–77)
Platelets: 316 10*3/uL (ref 150–400)
RBC: 5.04 MIL/uL (ref 4.22–5.81)
RDW: 13.3 % (ref 11.5–15.5)
WBC: 6.9 10*3/uL (ref 4.0–10.5)

## 2014-07-24 LAB — CBG MONITORING, ED: GLUCOSE-CAPILLARY: 101 mg/dL — AB (ref 70–99)

## 2014-07-24 LAB — BASIC METABOLIC PANEL
Anion gap: 12 (ref 5–15)
BUN: 14 mg/dL (ref 6–23)
CO2: 24 mEq/L (ref 19–32)
Calcium: 9.7 mg/dL (ref 8.4–10.5)
Chloride: 97 mEq/L (ref 96–112)
Creatinine, Ser: 1.39 mg/dL — ABNORMAL HIGH (ref 0.50–1.35)
GFR calc Af Amer: 63 mL/min — ABNORMAL LOW (ref >90)
GFR, EST NON AFRICAN AMERICAN: 54 mL/min — AB (ref >90)
GLUCOSE: 124 mg/dL — AB (ref 70–99)
POTASSIUM: 4.3 meq/L (ref 3.7–5.3)
Sodium: 133 mEq/L — ABNORMAL LOW (ref 137–147)

## 2014-07-24 LAB — I-STAT TROPONIN, ED
Troponin i, poc: 0.01 ng/mL (ref 0.00–0.08)
Troponin i, poc: 0.07 ng/mL (ref 0.00–0.08)

## 2014-07-24 LAB — TROPONIN I: Troponin I: <0.3 ng/mL (ref <0.30)

## 2014-07-24 MED ORDER — ENOXAPARIN SODIUM 40 MG/0.4ML ~~LOC~~ SOLN
40.0000 mg | SUBCUTANEOUS | Status: DC
  Administered 2014-07-24: 23:00:00 40 mg via SUBCUTANEOUS
  Filled 2014-07-24 (×2): qty 0.4

## 2014-07-24 MED ORDER — SODIUM CHLORIDE 0.9 % IV SOLN
INTRAVENOUS | Status: DC
  Administered 2014-07-24 – 2014-07-25 (×2): via INTRAVENOUS

## 2014-07-24 MED ORDER — ONDANSETRON HCL 4 MG/2ML IJ SOLN: 4.0000 mg | Freq: Four times a day (QID) | INTRAMUSCULAR | Status: DC | PRN

## 2014-07-24 MED ORDER — ONDANSETRON HCL 4 MG PO TABS: 4.0000 mg | ORAL_TABLET | Freq: Four times a day (QID) | ORAL | Status: DC | PRN

## 2014-07-24 MED ORDER — SODIUM CHLORIDE 0.9 % IJ SOLN
3.0000 mL | Freq: Two times a day (BID) | INTRAMUSCULAR | Status: DC
  Administered 2014-07-24: 3 mL via INTRAVENOUS

## 2014-07-24 MED ORDER — HYDRALAZINE HCL 20 MG/ML IJ SOLN
10.0000 mg | Freq: Once | INTRAMUSCULAR | Status: AC
  Administered 2014-07-25: 10 mg via INTRAVENOUS
  Filled 2014-07-24: qty 1

## 2014-07-24 MED ORDER — SODIUM CHLORIDE 0.9 % IV SOLN: 1000.0000 mL | INTRAVENOUS | Status: DC

## 2014-07-24 MED ORDER — SODIUM CHLORIDE 0.9 % IV SOLN
1000.0000 mL | Freq: Once | INTRAVENOUS | Status: AC
  Administered 2014-07-24: 1000 mL via INTRAVENOUS

## 2014-07-24 MED ORDER — BENZTROPINE MESYLATE 0.5 MG PO TABS
0.5000 mg | ORAL_TABLET | Freq: Every evening | ORAL | Status: DC
  Administered 2014-07-24: 0.5 mg via ORAL
  Filled 2014-07-24 (×2): qty 1

## 2014-07-24 MED ORDER — ASPIRIN EC 325 MG PO TBEC
325.0000 mg | DELAYED_RELEASE_TABLET | Freq: Every day | ORAL | Status: DC
  Administered 2014-07-24 – 2014-07-25 (×2): 325 mg via ORAL
  Filled 2014-07-24 (×2): qty 1

## 2014-07-24 MED ORDER — ACETAMINOPHEN 650 MG RE SUPP: 650.0000 mg | Freq: Four times a day (QID) | RECTAL | Status: DC | PRN

## 2014-07-24 MED ORDER — FLUPHENAZINE HCL 1 MG PO TABS
3.0000 mg | ORAL_TABLET | Freq: Every day | ORAL | Status: DC
  Administered 2014-07-24: 3 mg via ORAL
  Filled 2014-07-24 (×2): qty 3

## 2014-07-24 MED ORDER — SODIUM CHLORIDE 0.9 % IV SOLN: 1000.0000 mL | Freq: Once | INTRAVENOUS | Status: DC

## 2014-07-24 MED ORDER — ACETAMINOPHEN 325 MG PO TABS: 650.0000 mg | ORAL_TABLET | Freq: Four times a day (QID) | ORAL | Status: DC | PRN

## 2014-07-24 NOTE — ED Provider Notes (Signed)
Medical screening examination/treatment/procedure(s) were performed by non-physician practitioner and as supervising physician I was immediately available for consultation/collaboration.   EKG Interpretation   Date/Time:  Wednesday July 24 2014 16:00:49 EDT Ventricular Rate:  83 PR Interval:  164 QRS Duration: 86 QT Interval:  384 QTC Calculation: 451 R Axis:   43 Text Interpretation:  Sinus rhythm Probable left atrial enlargement  Confirmed by WARD,  DO, KRISTEN ([id number]) on 07/24/2014 4:36:17 PM        Reeves, DO 07/24/14 2129

## 2014-07-24 NOTE — Progress Notes (Signed)
Discussed admission status with Dr. Carles Collet.

## 2014-07-24 NOTE — ED Notes (Signed)
Pt states that he was mowing his grass when he sat down on his porch and passed out. No head injury. States he did not eat today. Orthostatic BP changes. Alert and oriented. 500cc saline IV given en route.

## 2014-07-24 NOTE — Progress Notes (Signed)
Patient's B/P was 206/93 upon admission to his room on [place].  PCP on call was notified.  Will continue to monitor the patient.

## 2014-07-24 NOTE — ED Provider Notes (Signed)
CSN: [id number]     Arrival date & time 07/24/14  1544 History   First MD Initiated Contact with Patient 07/24/14 1550     Chief Complaint  Patient presents with  . Loss of Consciousness     (Consider location/radiation/quality/duration/timing/severity/associated sxs/prior Treatment) HPI  Roy Barber is a 58 y.o. male brought in by EMS for syncope. Patient states he was in his normal state of health was mowing lawn this afternoon (push mower for several minutes, it was not very hot out today), he finished sat down on the porch and had an unwitnessed syncopal event at 2:30 PM.  He states that he slumped over he did not actually fall no head trauma. Patient states he felt slightly nauseous before the event. Denies any chest pain, palpitations, shortness of breath, history of DVT or PE, fever, chills, nausea, vomiting, headache, change in bowel or bladder habits. No complaints at this time. No personal or family history of cardiac issues. No history of syncope.   Cardiac risk factor of hypertension  Past Medical History  Diagnosis Date  . Hypertension   . Muscle stiffness   . Schizophrenia    Past Surgical History  Procedure Laterality Date  . No past surgeries     History reviewed. No pertinent family history. History  Substance Use Topics  . Smoking status: Never Smoker   . Smokeless tobacco: Never Used  . Alcohol Use: No    Review of Systems  10 systems reviewed and found to be negative, except as noted in the HPI.  Allergies  Review of patient's allergies indicates no known allergies.  Home Medications   Prior to Admission medications   Medication Sig Start Date End Date Taking? Authorizing Provider  benztropine (COGENTIN) 0.5 MG tablet Take 0.5 mg by mouth every evening.   Yes Historical Provider, MD  fluPHENAZine (PROLIXIN) 1 MG tablet Take 3 mg by mouth at bedtime.   Yes Historical Provider, MD   BP 178/90  Pulse 80  Temp(Src) 98.1 F (36.7 C) (Oral)  Resp  16  SpO2 99% Physical Exam  Nursing note and vitals reviewed. Constitutional: He is oriented to person, place, and time. He appears well-developed and well-nourished. No distress.  HENT:  Head: Normocephalic and atraumatic.  Mouth/Throat: Oropharynx is clear and moist.  No abrasions or contusions.   No hemotympanum, battle signs or raccoon's eyes  No crepitance or tenderness to palpation along the orbital rim.  EOMI intact with no pain or diplopia  No abnormal otorrhea or rhinorrhea. Nasal septum midline.  No intraoral trauma.  Eyes: Conjunctivae and EOM are normal. Pupils are equal, round, and reactive to light.  Neck: Normal range of motion. Neck supple. No JVD present.  No midline C-spine  tenderness to palpation or step-offs appreciated. Patient has full range of motion without pain.   Cardiovascular: Normal rate, regular rhythm and intact distal pulses.   Pulmonary/Chest: Effort normal and breath sounds normal. No stridor. No respiratory distress. He has no wheezes. He has no rales. He exhibits no tenderness.  No TTP or crepitance  Abdominal: Soft. Bowel sounds are normal. He exhibits no distension and no mass. There is no tenderness. There is no rebound and no guarding.  Musculoskeletal: Normal range of motion. He exhibits no edema and no tenderness.  Pelvis stable. No deformity or TTP of major joints.   Good ROM  Neurological: He is alert and oriented to person, place, and time.  Strength 5/5 x4 extremities   Distal sensation  intact  Skin: Skin is warm.  Psychiatric: He has a normal mood and affect.    ED Course  Procedures (including critical care time) Labs Review Labs Reviewed  BASIC METABOLIC PANEL - Abnormal; Notable for the following:    Sodium 133 (*)    Glucose, Bld 124 (*)    Creatinine, Ser 1.39 (*)    GFR calc non Af Amer 54 (*)    GFR calc Af Amer 63 (*)    All other components within normal limits  CBG MONITORING, ED - Abnormal; Notable for the  following:    Glucose-Capillary 101 (*)    All other components within normal limits  CBC WITH DIFFERENTIAL  POCT CBG (FASTING - GLUCOSE)-MANUAL ENTRY  I-STAT TROPOININ, ED  I-STAT TROPOININ, ED    Imaging Review Dg Chest Port 1 View  07/24/2014   CLINICAL DATA:  Syncope after mowing grass today, history hypertension  EXAM: PORTABLE CHEST - 1 VIEW  COMPARISON:  Portable exam 1617 hr compared to 10/12/2013  FINDINGS: Normal heart size, mediastinal contours and pulmonary vascularity.  Lungs clear.  No pleural effusion or pneumothorax.  Bones unremarkable.  IMPRESSION: No acute abnormalities   Electronically Signed   By: Lavonia Dana M.D.   On: 07/24/2014 16:27     EKG Interpretation   Date/Time:  Wednesday July 24 2014 16:00:49 EDT Ventricular Rate:  83 PR Interval:  164 QRS Duration: 86 QT Interval:  384 QTC Calculation: 451 R Axis:   43 Text Interpretation:  Sinus rhythm Probable left atrial enlargement  Confirmed by WARD,  DO, KRISTEN [phone]) on 07/24/2014 4:36:17 PM      MDM   Final diagnoses:  Syncope, unspecified syncope type  Abnormal EKG    Filed Vitals:   07/24/14 1553 07/24/14 1801  BP: 125/69 178/90  Pulse: 77 80  Temp: 98.1 F (36.7 C)   TempSrc: Oral   Resp:  16  SpO2: 95% 99%    Medications  0.9 %  sodium chloride infusion (0 mLs Intravenous Stopped 07/24/14 2028)    Followed by  0.9 %  sodium chloride infusion (not administered)    Followed by  0.9 %  sodium chloride infusion (not administered)    Roy Barber is a 58 y.o. male presenting with non-exertion cold that this afternoon. EKG shows new T wave inversions in lead 1. Has cardiac risk factor of hypertension, has never had a stress test. No history of CHF. Has never seen a cardiologist. His eye appear to be volume overloaded. Patient has a mildly elevated creatinine of 1.39. Patient was given 2 L normal saline in the ED. Plan is to obtain a delta troponin, by mouth challenge and with the  patient.  Patient's past by mouth challenge and test of ambulation. Delta troponin is negative at 0.07 however there is a significant rise in the initial troponin of 0.01. I discussed the options with the patient of obtaining a third troponin at 6 hours versus admission he has opted for admission.  Case discussed with triad hospitalist Dr. Carles Collet, who will admit       Monico Blitz, PA-C 07/24/14 2059

## 2014-07-24 NOTE — ED Notes (Signed)
Bed: WA03 Expected date:  Expected time:  Means of arrival:  Comments: EMS-syncope 

## 2014-07-24 NOTE — Progress Notes (Signed)
Utilization Review completed.  Lexie Koehl RN CM  

## 2014-07-24 NOTE — ED Notes (Signed)
Patient ambulated 67 feet with no assistance.

## 2014-07-24 NOTE — ED Notes (Signed)
Hospitalist at bedside 

## 2014-07-24 NOTE — H&P (Signed)
History and Physical  Roy Barber OEU:[id number] DOB: [birth year] DOA: 07/24/2014   PCP: PROVIDER NOT IN SYSTEM   Chief Complaint: Syncope  HPI:  58 year old male with a history of schizophrenia presented to the emergency department with a syncopal episode earlier on the day of admission, 07/24/2014. The patient was mowing his lawn using a push mower when he began experiencing some dizziness and shortness of breath after about 2 minutes. He stopped mowing lawn and went to his porch to sit down on a chair. Shortly thereafter, the patient had a syncopal event that was witnessed by his brother and sister that was described as his head drooping backward. The episode lasted approximately one to 2 minutes. There was no tonic clonic activity. The patient woke up without any post-ictal symptoms. The patient denies any aura, incontinence, or tongue bite. The patient has been in his usual state of health, and he denied any previous fevers, chills, chest discomfort, shortness of breath, nausea, vomiting, diarrhea, abdominal pain, dysuria, hematuria, hematochezia, melena.  The patient denies any tobacco, alcohol, or drug use. He has not been started on any new medications.  In the emergency department, the patient was noted to have sodium 133, serum creatinine 1.39. His CBC was unremarkable. EKG showed sinus rhythm with T-wave inversion V3 to V6 which was unchanged from previous EKGs, but the patient had new T-wave inversion in I-II. Point of care troponin was initially 0.01. Repeat point of care troponin 2 hours letter was 0.07. The patient was given 2 L of normal saline Assessment/Plan: Syncope -Although the orthostatics initially performed prior to my arrival were negative -I repeated orthostatics and the patient's heart rate went from 95 to 115 leading me to suspect a degree of orthostatic hypotension -Given the patient's symptoms and history with abnormal EKG--> cycle troponins -Start  aspirin -Echocardiogram -TSH -UA, UDS Abnormal EKG -As discussed above, new T-wave inversion I-II -Cycle troponin Acute on chronic renal failure (CKD2) -Baseline creatinine 1.0-1.2 -Likely from hypovolemia -IV fluids Hyponatremia -Likely due to hypovolemia -IV fluids Schizophrenia -Continue home medications     Past Medical History  Diagnosis Date  . Hypertension   . Muscle stiffness   . Schizophrenia    Past Surgical History  Procedure Laterality Date  . No past surgeries     Social History:  reports that he has never smoked. He has never used smokeless tobacco. He reports that he does not drink alcohol or use illicit drugs.   History reviewed. No pertinent family history.   No Known Allergies    Prior to Admission medications   Medication Sig Start Date End Date Taking? Authorizing Provider  benztropine (COGENTIN) 0.5 MG tablet Take 0.5 mg by mouth every evening.   Yes Historical Provider, MD  fluPHENAZine (PROLIXIN) 1 MG tablet Take 3 mg by mouth at bedtime.   Yes Historical Provider, MD    Review of Systems:  Constitutional:  No weight loss, night sweats, Fevers, chills, fatigue.  Head&Eyes: No headache.  No vision loss.  No eye pain or scotoma ENT:  No Difficulty swallowing,Tooth/dental problems,Sore throat,  No ear ache, post nasal drip,  Cardio-vascular:  No chest pain, Orthopnea, PND, swelling in lower extremities,   palpitations  GI:  No  abdominal pain, nausea, vomiting, diarrhea, loss of appetite, hematochezia, melena, heartburn, indigestion, Resp:  No  No cough. No coughing up of blood .No wheezing.No chest wall deformity  Skin:  no rash or lesions.  GU:  no dysuria,  change in color of urine, no urgency or frequency. No flank pain.  Musculoskeletal:  No joint pain or swelling. No decreased range of motion. No back pain.  Psych:  No change in mood or affect. Neurologic: No headache, no dysesthesia, no focal weakness, no vision loss. No  syncope  Physical Exam: Filed Vitals:   07/24/14 1553 07/24/14 1801  BP: 125/69 178/90  Pulse: 77 80  Temp: 98.1 F (36.7 C)   TempSrc: Oral   Resp:  16  SpO2: 95% 99%   General:  A&O x 3, NAD, nontoxic, pleasant/cooperative Head/Eye: No conjunctival hemorrhage, no icterus, Barnes/AT, No nystagmus ENT:  No icterus,  No thrush, poor dentition, no pharyngeal exudate Neck:  No masses, no lymphadenpathy, no bruits CV:  RRR, no rub, no gallop, no S3 Lung:  CTAB, good air movement, no wheeze, no rhonchi Abdomen: soft/NT, +BS, nondistended, no peritoneal signs Ext: No cyanosis, No rashes, No petechiae, No lymphangitis, No edema Neuro: CNII-XII intact, strength 4/5 in bilateral upper and lower extremities, no dysmetria  Labs on Admission:  Basic Metabolic Panel:  Recent Labs Lab 07/24/14 1645  NA 133*  K 4.3  CL 97  CO2 24  GLUCOSE 124*  BUN 14  CREATININE 1.39*  CALCIUM 9.7   Liver Function Tests: No results found for this basename: AST, ALT, ALKPHOS, BILITOT, PROT, ALBUMIN,  in the last 168 hours No results found for this basename: LIPASE, AMYLASE,  in the last 168 hours No results found for this basename: AMMONIA,  in the last 168 hours CBC:  Recent Labs Lab 07/24/14 1646  WBC 6.9  NEUTROABS 3.9  HGB 14.5  HCT 42.6  MCV 84.5  PLT 316   Cardiac Enzymes: No results found for this basename: CKTOTAL, CKMB, CKMBINDEX, TROPONINI,  in the last 168 hours BNP: No components found with this basename: POCBNP,  CBG:  Recent Labs Lab 07/24/14 1636  GLUCAP 101*    Radiological Exams on Admission: Dg Chest Port 1 View  07/24/2014   CLINICAL DATA:  Syncope after mowing grass today, history hypertension  EXAM: PORTABLE CHEST - 1 VIEW  COMPARISON:  Portable exam 1617 hr compared to 10/12/2013  FINDINGS: Normal heart size, mediastinal contours and pulmonary vascularity.  Lungs clear.  No pleural effusion or pneumothorax.  Bones unremarkable.  IMPRESSION: No acute abnormalities    Electronically Signed   By: Lavonia Dana M.D.   On: 07/24/2014 16:27    EKG: Independently reviewed. Sinus, t-wave inversion V3-V6, I-II    Time spent:60 minutes Code Status:   FULL Family Communication:   No Family at bedside   Nikolus Marczak, DO  [facility] Pager [phone]  If 7PM-7AM, please contact night-coverage [email or web address] Password [email or web address] 07/24/2014, 8:40 PM

## 2014-07-24 NOTE — ED Notes (Signed)
Pt given urinal and made aware of need for urine specimen 

## 2014-07-24 NOTE — ED Notes (Signed)
Patient transported to X-ray 

## 2014-07-25 DIAGNOSIS — I059 Rheumatic mitral valve disease, unspecified: Secondary | ICD-10-CM

## 2014-07-25 DIAGNOSIS — R55 Syncope and collapse: Secondary | ICD-10-CM

## 2014-07-25 LAB — CBC
HEMATOCRIT: 39.1 % (ref 39.0–52.0)
Hemoglobin: 13.4 g/dL (ref 13.0–17.0)
MCH: 28.5 pg (ref 26.0–34.0)
MCHC: 34.3 g/dL (ref 30.0–36.0)
MCV: 83 fL (ref 78.0–100.0)
Platelets: 344 10*3/uL (ref 150–400)
RBC: 4.71 MIL/uL (ref 4.22–5.81)
RDW: 13.3 % (ref 11.5–15.5)
WBC: 9.1 10*3/uL (ref 4.0–10.5)

## 2014-07-25 LAB — COMPREHENSIVE METABOLIC PANEL
ALK PHOS: 67 U/L (ref 39–117)
ALT: 22 U/L (ref 0–53)
ANION GAP: 11 (ref 5–15)
AST: 21 U/L (ref 0–37)
Albumin: 3.5 g/dL (ref 3.5–5.2)
BUN: 11 mg/dL (ref 6–23)
CALCIUM: 9.6 mg/dL (ref 8.4–10.5)
CO2: 24 mEq/L (ref 19–32)
Chloride: 104 mEq/L (ref 96–112)
Creatinine, Ser: 1.16 mg/dL (ref 0.50–1.35)
GFR calc non Af Amer: 68 mL/min — ABNORMAL LOW (ref >90)
GFR, EST AFRICAN AMERICAN: 78 mL/min — AB (ref >90)
GLUCOSE: 121 mg/dL — AB (ref 70–99)
POTASSIUM: 3.9 meq/L (ref 3.7–5.3)
Sodium: 139 mEq/L (ref 137–147)
Total Bilirubin: 0.8 mg/dL (ref 0.3–1.2)
Total Protein: 7.4 g/dL (ref 6.0–8.3)

## 2014-07-25 LAB — TSH: TSH: 1.6 u[IU]/mL (ref 0.350–4.500)

## 2014-07-25 LAB — TROPONIN I

## 2014-07-25 NOTE — Progress Notes (Signed)
UR completed 

## 2014-07-25 NOTE — Discharge Summary (Signed)
Physician Discharge Summary  Roy Barber BWL:[id number] DOB: [birth year] DOA: 07/24/2014  PCP: PROVIDER NOT IN SYSTEM  Admit date: 07/24/2014 Discharge date: 07/25/2014  Time spent: 35 minutes  Recommendations for Outpatient Follow-up:  1. Follow up with PCP in 1-2 weeks   Discharge Diagnoses:  Active Problems:   Syncope   Orthostatic syncope   Abnormal EKG   Hyponatremia   Acute on chronic renal failure  Discharge Condition: stable  Diet recommendation: regular   Filed Weights   07/24/14 2142  Weight: 98.93 kg (218 lb 1.6 oz)   History of present illness/hospital course:  58 year old male with a history of schizophrenia presented to the emergency department with a syncopal episode earlier on the day of admission, 07/24/2014. The patient was mowing his lawn using a push mower when he began experiencing some dizziness and shortness of breath after about 2 minutes. He stopped mowing lawn and went to his porch to sit down on a chair. Shortly thereafter, the patient had a syncopal event that was witnessed by his brother and sister that was described as his head drooping backward. The episode lasted approximately one to 2 minutes. There was no tonic clonic activity. The patient woke up without any post-ictal symptoms. The patient denies any aura, incontinence, or tongue bite. The patient has been in his usual state of health, and he denied any previous fevers, chills, chest discomfort, shortness of breath, nausea, vomiting, diarrhea, abdominal pain, dysuria, hematuria, hematochezia, melena. The patient denies any tobacco, alcohol, or drug use. He has not been started on any new medications.   Patient was admitted to telemetry floor and monitored overnight. Telemetry was without events, cardiac enzymes negative. He underwent a 2D echo which showed normal ejection fraction without WMAs. His mild renal failure was thought to be due to dehydration and his renal function returned to normal  prior to discharge. Patient clinically improved with hydration without further symptoms and will be discharged home in stable condition to follow up with his PCP in 1-2 weeks.   Procedures:  2D echo Study Conclusions - Left ventricle: The cavity size was normal. Wall thickness was normal. Systolic function was vigorous. The estimated ejection fraction was in the range of 65% to 70%. Wall motion was normal; there were no regional wall motion abnormalities. There was an increased relative contribution of atrial contraction to ventricular filling. Doppler parameters are consistent with abnormal left ventricular relaxation (grade 1 diastolic dysfunction). - Mitral valve: There was mild regurgitation.   Consultations:  None   Discharge Exam: Filed Vitals:   07/24/14 2312 07/25/14 0152 07/25/14 0441 07/25/14 1402  BP: 195/90 154/76 152/83 153/79  Pulse: 88 98 90 92  Temp: 98.5 F (36.9 C)  98.3 F (36.8 C) 99.1 F (37.3 C)  TempSrc: Oral  Oral Oral  Resp: 16  18 18   Height:      Weight:      SpO2: 98%  100% 99%   General: NAD Cardiovascular: RRR Respiratory: CTA biL  Discharge Instructions    Medication List         benztropine 0.5 MG tablet  Commonly known as:  COGENTIN  Take 0.5 mg by mouth every evening.     fluPHENAZine 1 MG tablet  Commonly known as:  PROLIXIN  Take 3 mg by mouth at bedtime.           Follow-up Information   Follow up with [facility]. Schedule an appointment as soon as possible  for a visit in 4 weeks.   Contact information:   [place] [id number] [phone]      The results of significant diagnostics from this hospitalization (including imaging, microbiology, ancillary and laboratory) are listed below for reference.    Significant Diagnostic Studies: Dg Chest Port 1 View  07/24/2014   CLINICAL DATA:  Syncope after mowing grass today, history hypertension  EXAM: PORTABLE CHEST - 1  VIEW  COMPARISON:  Portable exam 1617 hr compared to 10/12/2013  FINDINGS: Normal heart size, mediastinal contours and pulmonary vascularity.  Lungs clear.  No pleural effusion or pneumothorax.  Bones unremarkable.  IMPRESSION: No acute abnormalities   Electronically Signed   By: Lavonia Dana M.D.   On: 07/24/2014 16:27   Labs: Basic Metabolic Panel:  Recent Labs Lab 07/24/14 1645 07/25/14 0357  NA 133* 139  K 4.3 3.9  CL 97 104  CO2 24 24  GLUCOSE 124* 121*  BUN 14 11  CREATININE 1.39* 1.16  CALCIUM 9.7 9.6   Liver Function Tests:  Recent Labs Lab 07/25/14 0357  AST 21  ALT 22  ALKPHOS 67  BILITOT 0.8  PROT 7.4  ALBUMIN 3.5   CBC:  Recent Labs Lab 07/24/14 1646 07/25/14 0357  WBC 6.9 9.1  NEUTROABS 3.9  --   HGB 14.5 13.4  HCT 42.6 39.1  MCV 84.5 83.0  PLT 316 344   Cardiac Enzymes:  Recent Labs Lab 07/24/14 2222 07/25/14 0357 07/25/14 0920  TROPONINI <0.30 <0.30 <0.30   CBG:  Recent Labs Lab 07/24/14 1636  GLUCAP 101*   Signed:  Khilee Barber  [facility] 07/25/2014, 4:53 PM

## 2014-07-25 NOTE — Progress Notes (Signed)
*  PRELIMINARY RESULTS* Echocardiogram 2D Echocardiogram has been performed.  Roy Barber 07/25/2014, 12:45 PM

## 2014-07-25 NOTE — Discharge Instructions (Signed)

## 2014-07-29 ENCOUNTER — Encounter: Payer: Self-pay | Admitting: Internal Medicine

## 2014-07-29 ENCOUNTER — Ambulatory Visit: Payer: PRIVATE HEALTH INSURANCE | Attending: Internal Medicine | Admitting: Internal Medicine

## 2014-07-29 VITALS — BP 154/103 | HR 99 | Temp 98.4°F | Resp 16 | Ht 74.0 in | Wt 218.0 lb

## 2014-07-29 DIAGNOSIS — F209 Schizophrenia, unspecified: Secondary | ICD-10-CM | POA: Insufficient documentation

## 2014-07-29 DIAGNOSIS — M6289 Other specified disorders of muscle: Secondary | ICD-10-CM | POA: Insufficient documentation

## 2014-07-29 DIAGNOSIS — Z23 Encounter for immunization: Secondary | ICD-10-CM

## 2014-07-29 DIAGNOSIS — I10 Essential (primary) hypertension: Secondary | ICD-10-CM | POA: Diagnosis present

## 2014-07-29 NOTE — Progress Notes (Signed)
Patient ID: Roy Barber, male   DOB: 1956/09/08, 58 y.o.   MRN: [id number]  HCW:[id number]  [id number]  DOB - 27-Jul-1956  CC:  Chief Complaint  Patient presents with  . Establish Care       HPI: Roy Barber is a 58 y.o. male here today to establish medical care.  Patient reports that he has a PCP which is Dr. Lindwood Qua.  He reports that he has been managing his BP and has given him a prescription for amlodipine 5 mg, clonidine 0.1 mg QD, lasix 40 mg QD, and lisinopril 10 mg QD.  He presents with a prescription dates as 03/25/14 that reveal these medications. He reports that these medications are not new to him and they have controlled him in the past. He states that he was told to follow up with our clinic but he also has appt with his PCP in a couple of weeks.  He states that he would like to continue care with Dr. Kennon Holter.  He denies any syncopal episodes since discharge. He states that he has had blood work about 3 months ago at Dr. Odie Sera office.     Patient has No headache, No chest pain, No abdominal pain - No Nausea, No new weakness tingling or numbness, No Cough - SOB.  No Known Allergies Past Medical History  Diagnosis Date  . Hypertension   . Muscle stiffness   . Schizophrenia    Current Outpatient Prescriptions on File Prior to Visit  Medication Sig Dispense Refill  . benztropine (COGENTIN) 0.5 MG tablet Take 0.5 mg by mouth every evening.      . fluPHENAZine (PROLIXIN) 1 MG tablet Take 3 mg by mouth at bedtime.       No current facility-administered medications on file prior to visit.   History reviewed. No pertinent family history. History   Social History  . Marital Status: Single    Spouse Name: N/A    Number of Children: N/A  . Years of Education: N/A   Occupational History  . Not on file.   Social History Main Topics  . Smoking status: Never Smoker   . Smokeless tobacco: Never Used  . Alcohol Use: No  . Drug Use: No  . Sexual Activity: Not on  file   Other Topics Concern  . Not on file   Social History Narrative  . No narrative on file   Review of Systems  Constitutional: Negative.   HENT: Negative for hearing loss.   Respiratory: Negative.   Cardiovascular: Negative.   Gastrointestinal: Negative.   Genitourinary: Negative.   Musculoskeletal: Negative.   Neurological: Negative for dizziness, tingling and headaches.  Psychiatric/Behavioral: Negative.       Objective:   Filed Vitals:   07/29/14 1205  BP: 154/103  Pulse: 99  Temp: 98.4 F (36.9 C)  Resp: 16   Physical Exam  Constitutional: He is oriented to person, place, and time.  HENT:  Right Ear: External ear normal.  Left Ear: External ear normal.  Mouth/Throat: Oropharynx is clear and moist.  Eyes: Conjunctivae and EOM are normal. Pupils are equal, round, and reactive to light.  Neck: Normal range of motion. Neck supple. No thyromegaly present.  Cardiovascular: Normal rate, regular rhythm and normal heart sounds.   Pulmonary/Chest: Effort normal and breath sounds normal.  Abdominal: Soft. Bowel sounds are normal.  Musculoskeletal: Normal range of motion. He exhibits no edema and no tenderness.  Neurological: He is alert and oriented to person, place, and  time. He has normal reflexes.  Skin: Skin is warm and dry.  Psychiatric:  Withdrawn behavior Questionable mental function     Lab Results  Component Value Date   WBC 9.1 07/25/2014   HGB 13.4 07/25/2014   HCT 39.1 07/25/2014   MCV 83.0 07/25/2014   PLT 344 07/25/2014   Lab Results  Component Value Date   CREATININE 1.16 07/25/2014   BUN 11 07/25/2014   NA 139 07/25/2014   K 3.9 07/25/2014   CL 104 07/25/2014   CO2 24 07/25/2014    No results found for this basename: HGBA1C   Lipid Panel  No results found for this basename: chol, trig, hdl, cholhdl, vldl, ldlcalc       Assessment and plan:   Roy Barber was seen today for establish care.  Diagnoses and associated orders for this  visit:  HYPERTENSION Patient should follow up in 1 week for a BP check with his PCP.  He is on a great deal of medications and reports that he was out of all his medications for a couple of months.  He states that he is getting is medication refilled today. Explained signs and symptoms of low BP and encouraged him to check his BP whenever possible.    Explained to patient that I would start out slowly with amlodipine and lisinopril and then go back to his PCP for a blood pressure recheck.  Patient states that he would rather follow up with his PCP for management rather than here.    Receive flu vaccine today  Return if symptoms worsen or fail to improve.    Chari Manning, NP-C [facility] [phone] 07/29/2014, 12:13 PM

## 2014-07-29 NOTE — Progress Notes (Signed)
HFU Pt is here to establish care. Pt was in the ED with HTN, abnormal EKG and syncope.

## 2014-07-29 NOTE — Patient Instructions (Addendum)
Please begin only amlodipine and lisinopril daily until able to bet a BP recheck with Dr. Kennon Holter    DASH Eating Plan DASH stands for "Dietary Approaches to Stop Hypertension." The DASH eating plan is a healthy eating plan that has been shown to reduce high blood pressure (hypertension). Additional health benefits may include reducing the risk of type 2 diabetes mellitus, heart disease, and stroke. The DASH eating plan may also help with weight loss. WHAT DO I NEED TO KNOW ABOUT THE DASH EATING PLAN? For the DASH eating plan, you will follow these general guidelines:  Choose foods with a percent daily value for sodium of less than 5% (as listed on the food label).  Use salt-free seasonings or herbs instead of table salt or sea salt.  Check with your health care provider or pharmacist before using salt substitutes.  Eat lower-sodium products, often labeled as "lower sodium" or "no salt added."  Eat fresh foods.  Eat more vegetables, fruits, and low-fat dairy products.  Choose whole grains. Look for the word "whole" as the first word in the ingredient list.  Choose fish and skinless chicken or [place] more often than red meat. Limit fish, poultry, and meat to 6 oz (170 g) each day.  Limit sweets, desserts, sugars, and sugary drinks.  Choose heart-healthy fats.  Limit cheese to 1 oz (28 g) per day.  Eat more home-cooked food and less restaurant, buffet, and fast food.  Limit fried foods.  Cook foods using methods other than frying.  Limit canned vegetables. If you do use them, rinse them well to decrease the sodium.  When eating at a restaurant, ask that your food be prepared with less salt, or no salt if possible. WHAT FOODS CAN I EAT? Seek help from a dietitian for individual calorie needs. Grains Whole grain or whole wheat bread. Brown rice. Whole grain or whole wheat pasta. Quinoa, bulgur, and whole grain cereals. Low-sodium cereals. Corn or whole wheat flour tortillas.  Whole grain cornbread. Whole grain crackers. Low-sodium crackers. Vegetables Fresh or frozen vegetables (raw, steamed, roasted, or grilled). Low-sodium or reduced-sodium tomato and vegetable juices. Low-sodium or reduced-sodium tomato sauce and paste. Low-sodium or reduced-sodium canned vegetables.  Fruits All fresh, canned (in natural juice), or frozen fruits. Meat and Other Protein Products Ground beef (85% or leaner), grass-fed beef, or beef trimmed of fat. Skinless chicken or [place]. Ground chicken or [place]. Pork trimmed of fat. All fish and seafood. Eggs. Dried beans, peas, or lentils. Unsalted nuts and seeds. Unsalted canned beans. Dairy Low-fat dairy products, such as skim or 1% milk, 2% or reduced-fat cheeses, low-fat ricotta or cottage cheese, or plain low-fat yogurt. Low-sodium or reduced-sodium cheeses. Fats and Oils Tub margarines without trans fats. Light or reduced-fat mayonnaise and salad dressings (reduced sodium). Avocado. Safflower, olive, or canola oils. Natural peanut or almond butter. Other Unsalted popcorn and pretzels. The items listed above may not be a complete list of recommended foods or beverages. Contact your dietitian for more options. WHAT FOODS ARE NOT RECOMMENDED? Grains White bread. White pasta. White rice. Refined cornbread. Bagels and croissants. Crackers that contain trans fat. Vegetables Creamed or fried vegetables. Vegetables in a cheese sauce. Regular canned vegetables. Regular canned tomato sauce and paste. Regular tomato and vegetable juices. Fruits Dried fruits. Canned fruit in light or heavy syrup. Fruit juice. Meat and Other Protein Products Fatty cuts of meat. Ribs, chicken wings, bacon, sausage, bologna, salami, chitterlings, fatback, hot dogs, bratwurst, and packaged luncheon meats. Salted nuts and  seeds. Canned beans with salt. Dairy Whole or 2% milk, cream, half-and-half, and cream cheese. Whole-fat or sweetened yogurt. Full-fat cheeses or  blue cheese. Nondairy creamers and whipped toppings. Processed cheese, cheese spreads, or cheese curds. Condiments Onion and garlic salt, seasoned salt, table salt, and sea salt. Canned and packaged gravies. Worcestershire sauce. Tartar sauce. Barbecue sauce. Teriyaki sauce. Soy sauce, including reduced sodium. Steak sauce. Fish sauce. Oyster sauce. Cocktail sauce. Horseradish. Ketchup and mustard. Meat flavorings and tenderizers. Bouillon cubes. Hot sauce. Tabasco sauce. Marinades. Taco seasonings. Relishes. Fats and Oils Butter, stick margarine, lard, shortening, ghee, and bacon fat. Coconut, palm kernel, or palm oils. Regular salad dressings. Other Pickles and olives. Salted popcorn and pretzels. The items listed above may not be a complete list of foods and beverages to avoid. Contact your dietitian for more information. WHERE CAN I FIND MORE INFORMATION? National Heart, [employer], and [employer] Institute: travelstabloid.com Document Released: 11/04/2011 Document Revised: 04/01/2014 Document Reviewed: 09/19/2013 [facility] Patient Information 2015 [place], [place]. This information is not intended to replace advice given to you by your health care provider. Make sure you discuss any questions you have with your health care provider.

## 2014-08-29 ENCOUNTER — Emergency Department (HOSPITAL_COMMUNITY)
Admission: EM | Admit: 2014-08-29 | Discharge: 2014-08-29 | Disposition: A | Discharge status: Home / Self Care | Payer: PRIVATE HEALTH INSURANCE | Attending: Emergency Medicine | Admitting: Emergency Medicine

## 2014-08-29 ENCOUNTER — Encounter (HOSPITAL_COMMUNITY): Payer: Self-pay | Admitting: Emergency Medicine

## 2014-08-29 DIAGNOSIS — R1011 Right upper quadrant pain: Secondary | ICD-10-CM | POA: Diagnosis present

## 2014-08-29 DIAGNOSIS — Z8739 Personal history of other diseases of the musculoskeletal system and connective tissue: Secondary | ICD-10-CM | POA: Diagnosis not present

## 2014-08-29 DIAGNOSIS — I10 Essential (primary) hypertension: Secondary | ICD-10-CM | POA: Diagnosis not present

## 2014-08-29 DIAGNOSIS — Z79899 Other long term (current) drug therapy: Secondary | ICD-10-CM | POA: Insufficient documentation

## 2014-08-29 DIAGNOSIS — F209 Schizophrenia, unspecified: Secondary | ICD-10-CM | POA: Diagnosis not present

## 2014-08-29 DIAGNOSIS — R1010 Upper abdominal pain, unspecified: Secondary | ICD-10-CM

## 2014-08-29 LAB — URINALYSIS, ROUTINE W REFLEX MICROSCOPIC
BILIRUBIN URINE: NEGATIVE
GLUCOSE, UA: NEGATIVE mg/dL
HGB URINE DIPSTICK: NEGATIVE
Ketones, ur: NEGATIVE mg/dL
Leukocytes, UA: NEGATIVE
Nitrite: NEGATIVE
PROTEIN: NEGATIVE mg/dL
Specific Gravity, Urine: 1.003 — ABNORMAL LOW (ref 1.005–1.030)
Urobilinogen, UA: 0.2 mg/dL (ref 0.0–1.0)
pH: 5.5 (ref 5.0–8.0)

## 2014-08-29 LAB — CBC WITH DIFFERENTIAL/PLATELET
Basophils Absolute: 0 10*3/uL (ref 0.0–0.1)
Basophils Relative: 0 % (ref 0–1)
EOS PCT: 1 % (ref 0–5)
Eosinophils Absolute: 0.1 10*3/uL (ref 0.0–0.7)
HCT: 40.2 % (ref 39.0–52.0)
HEMOGLOBIN: 13.7 g/dL (ref 13.0–17.0)
LYMPHS ABS: 1.6 10*3/uL (ref 0.7–4.0)
Lymphocytes Relative: 28 % (ref 12–46)
MCH: 28.5 pg (ref 26.0–34.0)
MCHC: 34.1 g/dL (ref 30.0–36.0)
MCV: 83.8 fL (ref 78.0–100.0)
MONOS PCT: 11 % (ref 3–12)
Monocytes Absolute: 0.6 10*3/uL (ref 0.1–1.0)
NEUTROS PCT: 60 % (ref 43–77)
Neutro Abs: 3.3 10*3/uL (ref 1.7–7.7)
Platelets: 342 10*3/uL (ref 150–400)
RBC: 4.8 MIL/uL (ref 4.22–5.81)
RDW: 12.9 % (ref 11.5–15.5)
WBC: 5.6 10*3/uL (ref 4.0–10.5)

## 2014-08-29 LAB — COMPREHENSIVE METABOLIC PANEL
ALK PHOS: 69 U/L (ref 39–117)
ALT: 29 U/L (ref 0–53)
AST: 27 U/L (ref 0–37)
Albumin: 4.2 g/dL (ref 3.5–5.2)
Anion gap: 11 (ref 5–15)
BUN: 12 mg/dL (ref 6–23)
CO2: 26 meq/L (ref 19–32)
Calcium: 9.9 mg/dL (ref 8.4–10.5)
Chloride: 103 mEq/L (ref 96–112)
Creatinine, Ser: 1.15 mg/dL (ref 0.50–1.35)
GFR calc non Af Amer: 68 mL/min — ABNORMAL LOW (ref >90)
GFR, EST AFRICAN AMERICAN: 79 mL/min — AB (ref >90)
GLUCOSE: 116 mg/dL — AB (ref 70–99)
Potassium: 4.1 mEq/L (ref 3.7–5.3)
SODIUM: 140 meq/L (ref 137–147)
Total Bilirubin: 0.8 mg/dL (ref 0.3–1.2)
Total Protein: 8.4 g/dL — ABNORMAL HIGH (ref 6.0–8.3)

## 2014-08-29 LAB — LIPASE, BLOOD: Lipase: 157 U/L — ABNORMAL HIGH (ref 11–59)

## 2014-08-29 NOTE — ED Notes (Signed)
Pt c/o RUQ pain starting tonight.  Denies n/v/d, fever chills.  Denies any gall bladder problems.  Denies any change in diet.  NAD, respirations equal and unlabored, skin warm and dry.

## 2014-08-29 NOTE — ED Notes (Signed)
Pt. arrived with EMS from home reports intermittent RUQ pain onset this evening , denies nausea or vomitting , no fever or chills.

## 2014-08-29 NOTE — Discharge Instructions (Signed)

## 2014-08-29 NOTE — ED Provider Notes (Signed)
CSN: [id number]     Arrival date & time 08/29/14  0047 History   First MD Initiated Contact with Patient 08/29/14 [phone]     Chief Complaint  Patient presents with  . Abdominal Pain     HPI Patient reports transient right upper quadrant pain that lasted several minutes tonight.  He is asymptomatic at this time.  He reports there was no associated nausea or vomiting.  Never had pain like this before.  He states he just sat down She watched TV.  No fevers or chills.  No chest pain or shortness of breath.  No radiation of his pain.  No flank pain.  No urinary complaints.  Pain initially was mild to moderate in severity.  It lasted several minutes.  He states it then resolved.  He's been asymptomatic since then.  He reports no pain at this time.   Past Medical History  Diagnosis Date  . Hypertension   . Muscle stiffness   . Schizophrenia    Past Surgical History  Procedure Laterality Date  . No past surgeries     No family history on file. History  Substance Use Topics  . Smoking status: Never Smoker   . Smokeless tobacco: Never Used  . Alcohol Use: No    Review of Systems  All other systems reviewed and are negative.     Allergies  Review of patient's allergies indicates no known allergies.  Home Medications   Prior to Admission medications   Medication Sig Start Date End Date Taking? Authorizing Provider  amLODipine (NORVASC) 5 MG tablet Take 5 mg by mouth daily.   Yes Historical Provider, MD  benztropine (COGENTIN) 0.5 MG tablet Take 0.5 mg by mouth at bedtime.    Yes Historical Provider, MD  fluPHENAZine (PROLIXIN) 1 MG tablet Take 3 mg by mouth at bedtime.   Yes Historical Provider, MD  lisinopril (PRINIVIL,ZESTRIL) 10 MG tablet Take 10 mg by mouth daily.   Yes Historical Provider, MD  pravastatin (PRAVACHOL) 20 MG tablet Take 20 mg by mouth daily.   Yes Historical Provider, MD   BP 159/82  Pulse 83  Temp(Src) 98.6 F (37 C) (Oral)  Resp 16  Ht 6\' 3"  (1.905 m)   Wt 230 lb (104.327 kg)  BMI 28.75 kg/m2  SpO2 100% Physical Exam  Nursing note and vitals reviewed. Constitutional: He is oriented to person, place, and time. He appears well-developed and well-nourished.  HENT:  Head: Normocephalic and atraumatic.  Eyes: EOM are normal.  Neck: Normal range of motion.  Cardiovascular: Normal rate, regular rhythm, normal heart sounds and intact distal pulses.   Pulmonary/Chest: Effort normal and breath sounds normal. No respiratory distress.  Abdominal: Soft. He exhibits no distension. There is no tenderness.  Musculoskeletal: Normal range of motion.  Neurological: He is alert and oriented to person, place, and time.  Skin: Skin is warm and dry.  Psychiatric: He has a normal mood and affect. Judgment normal.    ED Course  Procedures (including critical care time) Labs Review Labs Reviewed  COMPREHENSIVE METABOLIC PANEL - Abnormal; Notable for the following:    Glucose, Bld 116 (*)    Total Protein 8.4 (*)    GFR calc non Af Amer 68 (*)    GFR calc Af Amer 79 (*)    All other components within normal limits  LIPASE, BLOOD - Abnormal; Notable for the following:    Lipase 157 (*)    All other components within normal limits  URINALYSIS,  ROUTINE W REFLEX MICROSCOPIC - Abnormal; Notable for the following:    APPearance CLOUDY (*)    Specific Gravity, Urine 1.003 (*)    All other components within normal limits  CBC WITH DIFFERENTIAL    Imaging Review No results found.   EKG Interpretation None      MDM   Final diagnoses:  Pain of upper abdomen    Asymptomatic at this time.  Transient pain.  No indication for additional workup.  Abdomen is soft.  PCP followup.  He understands to return to the ER for new or worsening symptoms    Hoy Morn, MD 08/29/14 [phone]

## 2016-01-13 ENCOUNTER — Encounter: Payer: Self-pay | Admitting: Gastroenterology

## 2016-01-19 ENCOUNTER — Encounter (HOSPITAL_COMMUNITY): Payer: Self-pay

## 2016-01-19 ENCOUNTER — Emergency Department (HOSPITAL_COMMUNITY): Payer: Medicaid Other

## 2016-01-19 ENCOUNTER — Emergency Department (HOSPITAL_COMMUNITY)
Admission: EM | Admit: 2016-01-19 | Discharge: 2016-01-19 | Disposition: A | Discharge status: Home / Self Care | Payer: Medicaid Other | Attending: Emergency Medicine | Admitting: Emergency Medicine

## 2016-01-19 DIAGNOSIS — R531 Weakness: Secondary | ICD-10-CM | POA: Insufficient documentation

## 2016-01-19 DIAGNOSIS — R109 Unspecified abdominal pain: Secondary | ICD-10-CM | POA: Insufficient documentation

## 2016-01-19 DIAGNOSIS — I10 Essential (primary) hypertension: Secondary | ICD-10-CM | POA: Insufficient documentation

## 2016-01-19 DIAGNOSIS — R0602 Shortness of breath: Secondary | ICD-10-CM | POA: Diagnosis not present

## 2016-01-19 LAB — COMPREHENSIVE METABOLIC PANEL
ALBUMIN: 4.4 g/dL (ref 3.5–5.0)
ALK PHOS: 55 U/L (ref 38–126)
ALT: 12 U/L — AB (ref 17–63)
ANION GAP: 11 (ref 5–15)
AST: 19 U/L (ref 15–41)
BUN: 12 mg/dL (ref 6–20)
CHLORIDE: 103 mmol/L (ref 101–111)
CO2: 26 mmol/L (ref 22–32)
Calcium: 9.8 mg/dL (ref 8.9–10.3)
Creatinine, Ser: 1.26 mg/dL — ABNORMAL HIGH (ref 0.61–1.24)
GFR calc non Af Amer: >60 mL/min (ref >60)
GLUCOSE: 141 mg/dL — AB (ref 65–99)
Potassium: 3.6 mmol/L (ref 3.5–5.1)
SODIUM: 140 mmol/L (ref 135–145)
Total Bilirubin: 1.1 mg/dL (ref 0.3–1.2)
Total Protein: 8.2 g/dL — ABNORMAL HIGH (ref 6.5–8.1)

## 2016-01-19 LAB — CBC
HCT: 40.7 % (ref 39.0–52.0)
HEMOGLOBIN: 13.5 g/dL (ref 13.0–17.0)
MCH: 28.3 pg (ref 26.0–34.0)
MCHC: 33.2 g/dL (ref 30.0–36.0)
MCV: 85.3 fL (ref 78.0–100.0)
Platelets: 329 10*3/uL (ref 150–400)
RBC: 4.77 MIL/uL (ref 4.22–5.81)
RDW: 13 % (ref 11.5–15.5)
WBC: 4.4 10*3/uL (ref 4.0–10.5)

## 2016-01-19 LAB — LIPASE, BLOOD: LIPASE: 32 U/L (ref 11–51)

## 2016-01-19 NOTE — ED Notes (Signed)
Attempted to call patient for an assigned room. No answer.  

## 2016-01-19 NOTE — ED Notes (Signed)
Per EMS, Pt, from home, c/o weakness, abdominal pain, and intermittent SOB starting this morning.  Denies n/v/d.  Lung sounds are clear.  Pt able to speak full sentences.

## 2016-03-16 ENCOUNTER — Encounter: Payer: Self-pay | Admitting: Family Medicine

## 2016-07-13 ENCOUNTER — Encounter: Payer: Self-pay | Admitting: Gastroenterology

## 2016-08-18 ENCOUNTER — Encounter (HOSPITAL_COMMUNITY): Payer: Self-pay | Admitting: Emergency Medicine

## 2016-08-18 ENCOUNTER — Emergency Department (HOSPITAL_COMMUNITY)
Admission: EM | Admit: 2016-08-18 | Discharge: 2016-08-18 | Disposition: A | Discharge status: Home / Self Care | Payer: Medicare Other | Attending: Emergency Medicine | Admitting: Emergency Medicine

## 2016-08-18 DIAGNOSIS — Z79899 Other long term (current) drug therapy: Secondary | ICD-10-CM | POA: Diagnosis not present

## 2016-08-18 DIAGNOSIS — R55 Syncope and collapse: Secondary | ICD-10-CM | POA: Diagnosis present

## 2016-08-18 DIAGNOSIS — I10 Essential (primary) hypertension: Secondary | ICD-10-CM | POA: Diagnosis not present

## 2016-08-18 DIAGNOSIS — I951 Orthostatic hypotension: Secondary | ICD-10-CM | POA: Diagnosis not present

## 2016-08-18 LAB — BASIC METABOLIC PANEL
ANION GAP: 8 (ref 5–15)
BUN: 10 mg/dL (ref 6–20)
CALCIUM: 9.6 mg/dL (ref 8.9–10.3)
CO2: 26 mmol/L (ref 22–32)
Chloride: 106 mmol/L (ref 101–111)
Creatinine, Ser: 1.27 mg/dL — ABNORMAL HIGH (ref 0.61–1.24)
GFR calc Af Amer: >60 mL/min (ref >60)
GFR calc non Af Amer: 60 mL/min — ABNORMAL LOW (ref >60)
GLUCOSE: 97 mg/dL (ref 65–99)
POTASSIUM: 4.2 mmol/L (ref 3.5–5.1)
Sodium: 140 mmol/L (ref 135–145)

## 2016-08-18 LAB — CBC WITH DIFFERENTIAL/PLATELET
Basophils Absolute: 0 10*3/uL (ref 0.0–0.1)
Basophils Relative: 0 %
Eosinophils Absolute: 0.1 10*3/uL (ref 0.0–0.7)
Eosinophils Relative: 1 %
HEMATOCRIT: 38.2 % — AB (ref 39.0–52.0)
Hemoglobin: 12.6 g/dL — ABNORMAL LOW (ref 13.0–17.0)
LYMPHS PCT: 14 %
Lymphs Abs: 1.2 10*3/uL (ref 0.7–4.0)
MCH: 28.9 pg (ref 26.0–34.0)
MCHC: 33 g/dL (ref 30.0–36.0)
MCV: 87.6 fL (ref 78.0–100.0)
MONO ABS: 0.5 10*3/uL (ref 0.1–1.0)
MONOS PCT: 6 %
NEUTROS ABS: 6.4 10*3/uL (ref 1.7–7.7)
Neutrophils Relative %: 79 %
Platelets: 253 10*3/uL (ref 150–400)
RBC: 4.36 MIL/uL (ref 4.22–5.81)
RDW: 13.3 % (ref 11.5–15.5)
WBC: 8.2 10*3/uL (ref 4.0–10.5)

## 2016-08-18 LAB — I-STAT TROPONIN, ED: TROPONIN I, POC: 0 ng/mL (ref 0.00–0.08)

## 2016-08-18 MED ORDER — ACETAMINOPHEN 325 MG PO TABS: 650.0000 mg | ORAL_TABLET | Freq: Once | ORAL | Status: DC

## 2016-08-18 MED ORDER — SODIUM CHLORIDE 0.9 % IV BOLUS (SEPSIS): 1000.0000 mL | Freq: Once | INTRAVENOUS | Status: DC

## 2016-08-18 NOTE — ED Provider Notes (Signed)
[facility] DEPT Provider Note   CSN: [id number] Arrival date & time: 08/18/16  1303     History   Chief Complaint Chief Complaint  Patient presents with  . Near Syncope    HPI Roy Barber is a 60 y.o. male.  The history is provided by the patient. No language interpreter was used.  Near Syncope     Roy Barber is a 60 y.o. male who presents to the Emergency Department complaining of near syncope.  He presents by EMS following a near syncopal episode. He was outside in the heat for about 30 minutes when he developed generalized weakness, feeling hot and sweaty and feeling as if he might pass out. Per EMS he was orthostatic and received 1 L normal saline. He has a history of hypertension and hyperlipidemia. No history of cardiac disease. He denies any chest pain, shortness of breath, nausea, vomiting. He currently states that he feels better.  Past Medical History:  Diagnosis Date  . Hypertension   . Muscle stiffness   . Schizophrenia [facility])     Patient Active Problem List   Diagnosis Date Noted  . Syncope 07/24/2014  . Orthostatic syncope 07/24/2014  . Abnormal EKG 07/24/2014  . Hyponatremia 07/24/2014  . Acute on chronic renal failure ([place]) 07/24/2014  . HYPERTENSION 12/24/2010  . HEMORRHOIDS 12/24/2010  . GERD 12/24/2010  . HEMORRHAGE OF RECTUM AND ANUS 12/24/2010  . CARDIAC MURMUR, SYSTOLIC [id number]  . ABDOMINAL BLOATING 12/24/2010  . RENAL CALCULUS, HX OF 12/24/2010    Past Surgical History:  Procedure Laterality Date  . NO PAST SURGERIES         Home Medications    Prior to Admission medications   Medication Sig Start Date End Date Taking? Authorizing Provider  furosemide (LASIX) 40 MG tablet Take 40 mg by mouth daily.   Yes Historical Provider, MD  lisinopril (PRINIVIL,ZESTRIL) 10 MG tablet Take 10 mg by mouth daily.   Yes Historical Provider, MD  pravastatin (PRAVACHOL) 40 MG tablet Take 40 mg by mouth daily. 07/20/16  Yes Historical  Provider, MD    Family History No family history on file.  Social History Social History  Substance Use Topics  . Smoking status: Never Smoker  . Smokeless tobacco: Never Used  . Alcohol use No     Allergies   Review of patient's allergies indicates no known allergies.   Review of Systems Review of Systems  Cardiovascular: Positive for near-syncope.  All other systems reviewed and are negative.    Physical Exam Updated Vital Signs BP 107/68   Pulse 77   Temp 97.6 F (36.4 C) (Oral)   Resp 22   Ht 6\' 1"  (1.854 m)   Wt 180 lb (81.6 kg)   SpO2 100%   BMI 23.75 kg/m   Physical Exam  Constitutional: He is oriented to person, place, and time. He appears well-developed and well-nourished.  HENT:  Head: Normocephalic and atraumatic.  Cardiovascular: Normal rate and regular rhythm.   No murmur heard. Pulmonary/Chest: Effort normal and breath sounds normal. No respiratory distress.  Abdominal: Soft. There is no tenderness. There is no rebound and no guarding.  Musculoskeletal: He exhibits no edema or tenderness.  Neurological: He is alert and oriented to person, place, and time.  Skin: Skin is warm and dry.  Psychiatric: He has a normal mood and affect. His behavior is normal.  Nursing note and vitals reviewed.    ED Treatments / Results  Labs (all labs ordered are  listed, but only abnormal results are displayed) Labs Reviewed  BASIC METABOLIC PANEL - Abnormal; Notable for the following:       Result Value   Creatinine, Ser 1.27 (*)    GFR calc non Af Amer 60 (*)    All other components within normal limits  CBC WITH DIFFERENTIAL/PLATELET - Abnormal; Notable for the following:    Hemoglobin 12.6 (*)    HCT 38.2 (*)    All other components within normal limits  I-STAT TROPOININ, ED  Randolm Idol, ED    EKG  EKG Interpretation  Date/Time:  Wednesday August 18 2016 13:08:45 EDT Ventricular Rate:  66 PR Interval:    QRS Duration: 88 QT  Interval:  394 QTC Calculation: 413 R Axis:   72 Text Interpretation:  Sinus rhythm Borderline ST elevation, anterior leads Confirmed by Hazle Coca [phone]) on 08/18/2016 1:21:36 PM       Radiology No results found.  Procedures Procedures (including critical care time)  Medications Ordered in ED Medications - No data to display   Initial Impression / Assessment and Plan / ED Course  I have reviewed the triage vital signs and the nursing notes.  Pertinent labs & imaging results that were available during my care of the patient were reviewed by me and considered in my medical decision making (see chart for details).  Clinical Course    Patient here for evaluation of near syncopal episode with orthostatic hypotension documented by EMS. He received 1 L of fluids IV in the emergency department. On repeat assessment he feels significantly improved with no recurrent symptoms. No chest pain or shortness of breath. Presentation is not consistent with ACS, PE. EKG with ST elevation consistent with early repolarization. DC home with outpatient follow-up. Home care and return precautions discussed.  Final Clinical Impressions(s) / ED Diagnoses   Final diagnoses:  Near syncope  Orthostasis    New Prescriptions New Prescriptions   No medications on file     Quintella Reichert, MD 08/18/16 [phone]

## 2016-08-18 NOTE — ED Notes (Addendum)
Pt states he is "feeling much better now," denied dizziness upon sitting and standing. [facility] EMT provided pt with [place] sandwich and ice water per MD.

## 2016-08-18 NOTE — ED Triage Notes (Addendum)
Pt in from work at [employer] after near syncopal episode. Pt was sitting outside, felt faint and had not eaten today. When EMS arrived, orthostatic VS were 108/72 - 90/50 - 94/65. CBG was 122, pt was given 164ml's NS by EMS. Hx of HTN, took all BP meds today. Per pt, "he got too hot and sweaty today". Alert, denies n/v, sob or dizziness.

## 2016-09-15 ENCOUNTER — Ambulatory Visit: Payer: Medicare Other | Admitting: *Deleted

## 2016-09-15 VITALS — Ht 73.0 in | Wt 172.0 lb

## 2016-09-15 DIAGNOSIS — Z86010 Personal history of colonic polyps: Secondary | ICD-10-CM

## 2016-09-15 MED ORDER — NA SULFATE-K SULFATE-MG SULF 17.5-3.13-1.6 GM/177ML PO SOLN: 1.0000 | Freq: Once | ORAL | 0 refills | Status: AC

## 2016-09-15 NOTE — Progress Notes (Signed)
Denies allergies to eggs or soy products. Denies complications with sedation or anesthesia. Denies O2 use. Denies use of diet or weight loss medications.  Emmi instructions declined for colonoscopy.  

## 2016-09-27 IMAGING — CR DG CHEST 2V
2 series · 2 of 2 positions shown · non-contrast
Comparison: 07/24/2014

CLINICAL DATA: Shortness of breath, weakness, diffuse abdominal
pain.

EXAM:
CHEST  2 VIEW

[w chest pa]
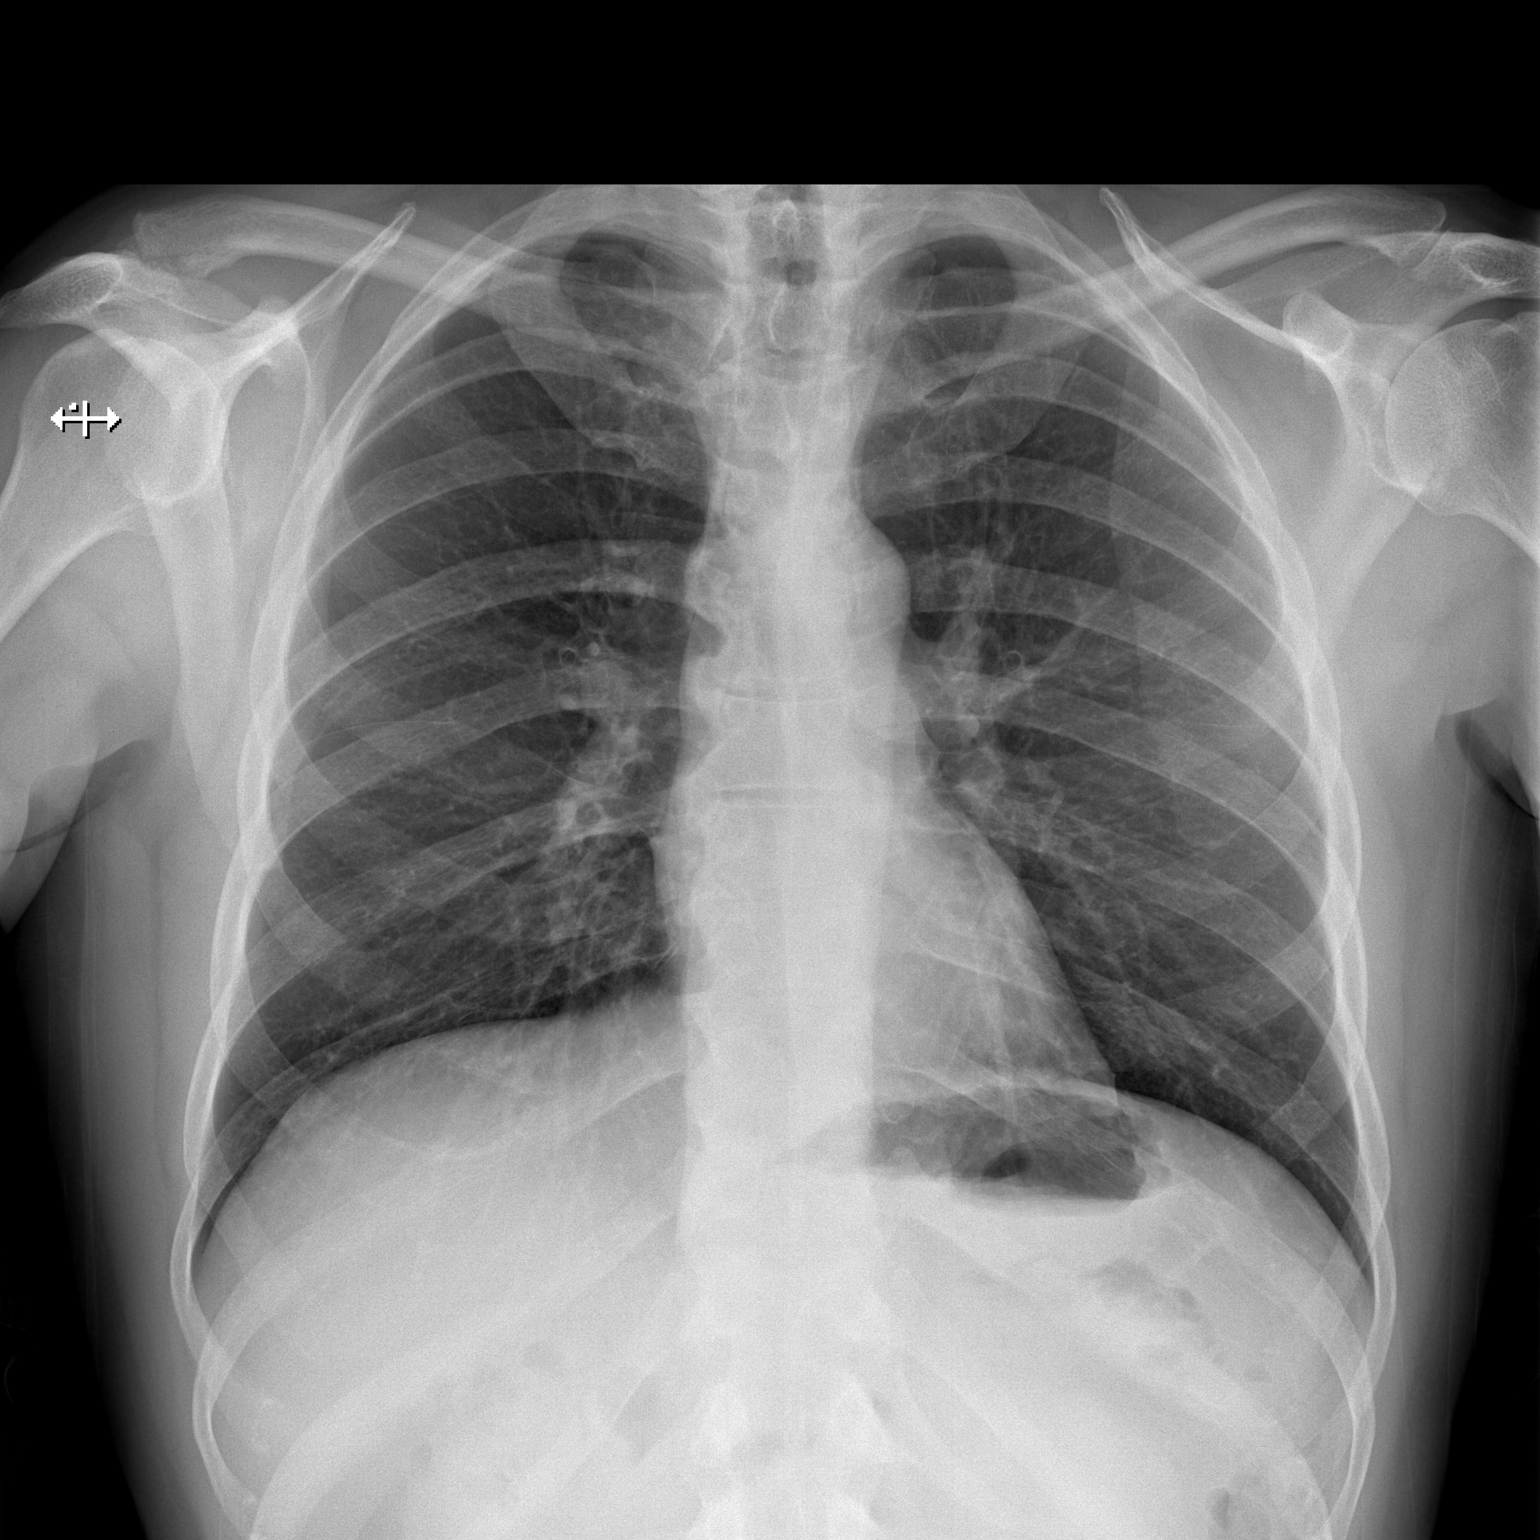

[w chest lat]
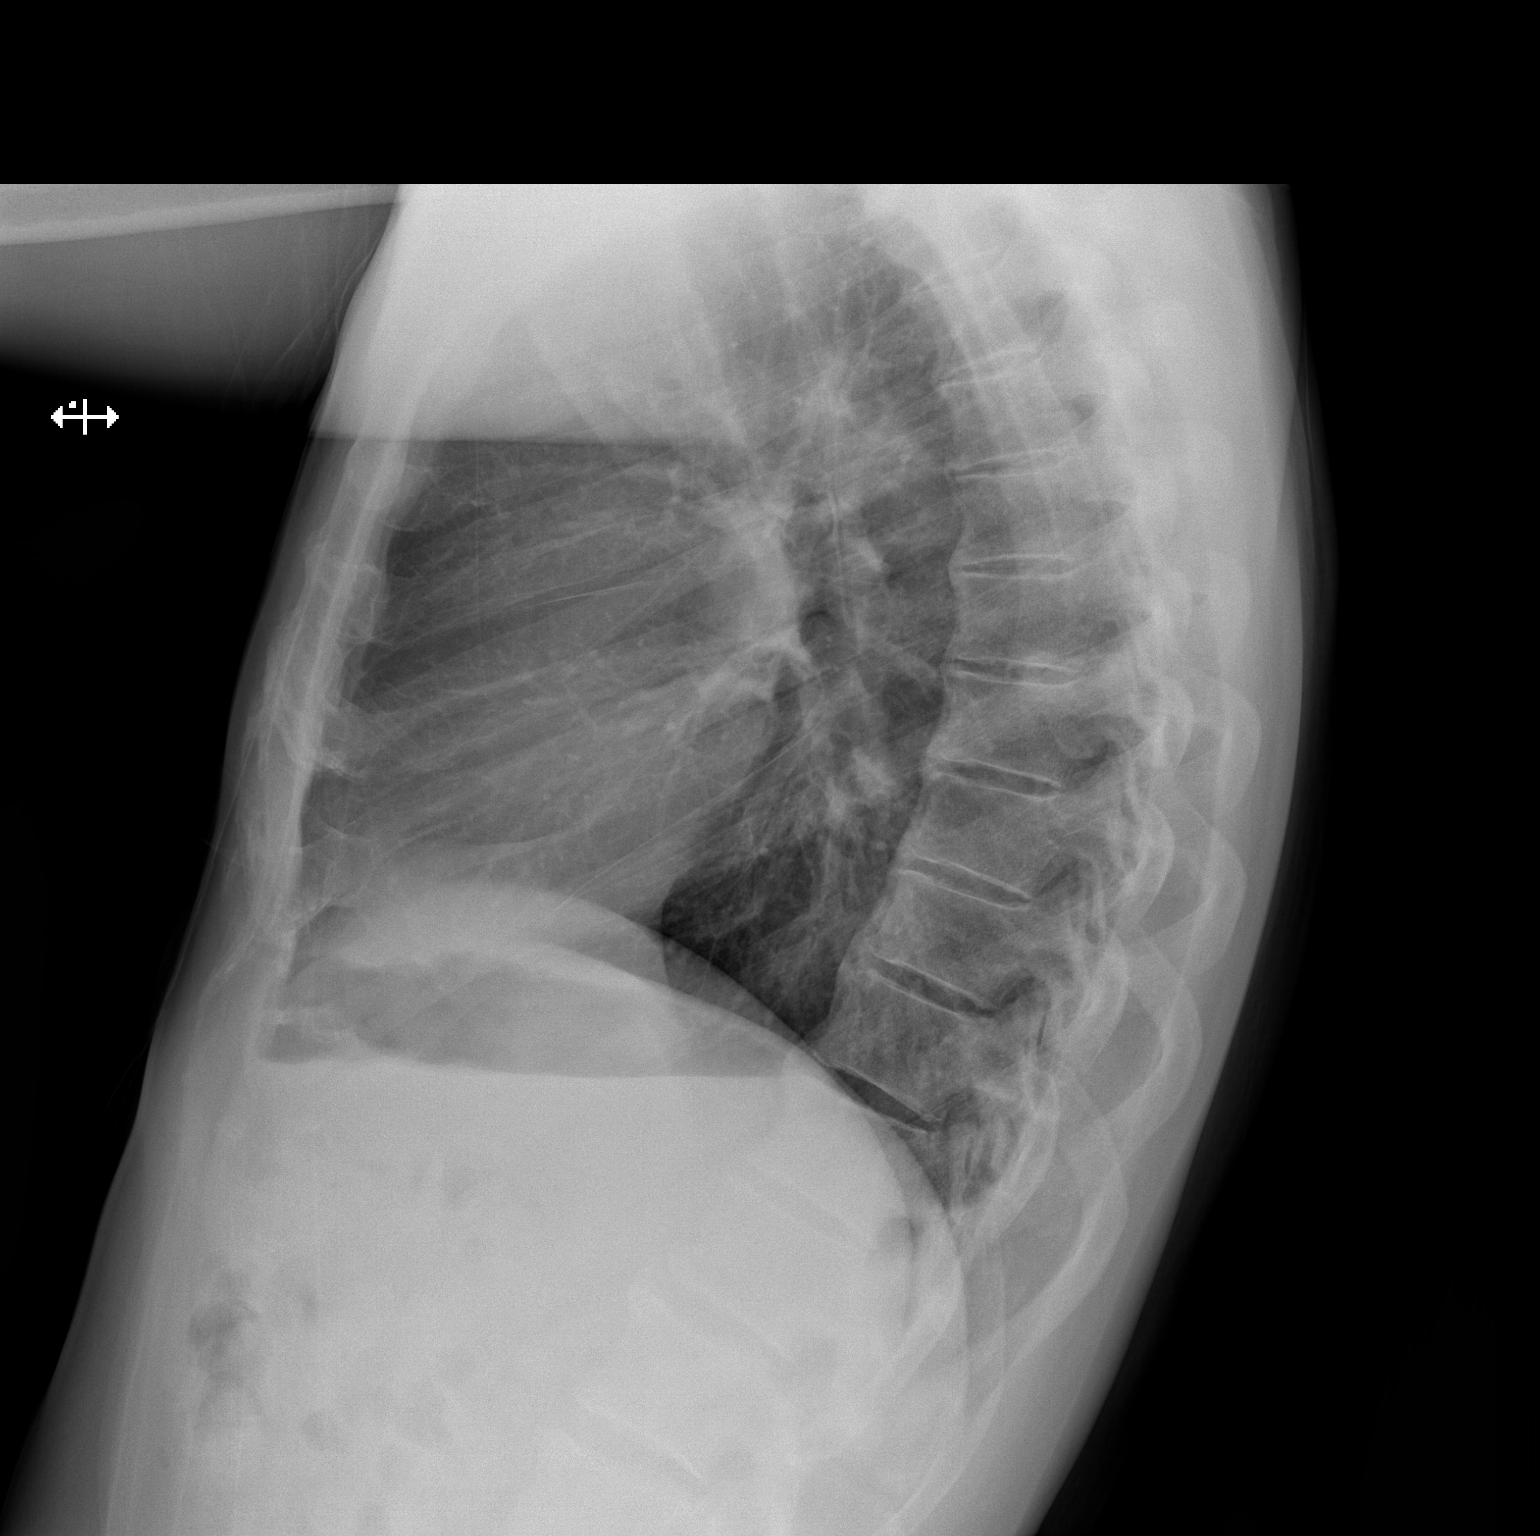

[2 of 2 positions shown; findings below may reference images not displayed]

FINDINGS: The cardiomediastinal silhouette is within normal limits. The lungs
are well inflated and clear. There is no evidence of pleural
effusion or pneumothorax. No acute osseous abnormality is
identified.
IMPRESSION: No active cardiopulmonary disease.

## 2016-09-29 ENCOUNTER — Encounter: Payer: Self-pay | Admitting: Gastroenterology

## 2016-09-29 ENCOUNTER — Ambulatory Visit (AMBULATORY_SURGERY_CENTER): Payer: Medicare Other | Admitting: Gastroenterology

## 2016-09-29 VITALS — BP 110/73 | HR 78 | Temp 98.6°F | Resp 13 | Ht 73.0 in | Wt 172.0 lb

## 2016-09-29 DIAGNOSIS — Z86010 Personal history of colonic polyps: Secondary | ICD-10-CM

## 2016-09-29 MED ORDER — SODIUM CHLORIDE 0.9 % IV SOLN: 500.0000 mL | INTRAVENOUS | Status: AC

## 2016-09-29 NOTE — Patient Instructions (Signed)
Discharge instructions given. Handout on diverticulosis. Resume previous medications. YOU HAD AN ENDOSCOPIC PROCEDURE TODAY AT THE [facility]:   Refer to the procedure report that was given to you for any specific questions about what was found during the examination.  If the procedure report does not answer your questions, please call your gastroenterologist to clarify.  If you requested that your care partner not be given the details of your procedure findings, then the procedure report has been included in a sealed envelope for you to review at your convenience later.  YOU SHOULD EXPECT: Some feelings of bloating in the abdomen. Passage of more gas than usual.  Walking can help get rid of the air that was put into your GI tract during the procedure and reduce the bloating. If you had a lower endoscopy (such as a colonoscopy or flexible sigmoidoscopy) you may notice spotting of blood in your stool or on the toilet paper. If you underwent a bowel prep for your procedure, you may not have a normal bowel movement for a few days.  Please Note:  You might notice some irritation and congestion in your nose or some drainage.  This is from the oxygen used during your procedure.  There is no need for concern and it should clear up in a day or so.  SYMPTOMS TO REPORT IMMEDIATELY:   Following lower endoscopy (colonoscopy or flexible sigmoidoscopy):  Excessive amounts of blood in the stool  Significant tenderness or worsening of abdominal pains  Swelling of the abdomen that is new, acute  Fever of 100F or higher   For urgent or emergent issues, a gastroenterologist can be reached at any hour by calling [phone].   DIET:  We do recommend a small meal at first, but then you may proceed to your regular diet.  Drink plenty of fluids but you should avoid alcoholic beverages for 24 hours.  ACTIVITY:  You should plan to take it easy for the rest of today and you should NOT DRIVE or use  heavy machinery until tomorrow (because of the sedation medicines used during the test).    FOLLOW UP: Our staff will call the number listed on your records the next business day following your procedure to check on you and address any questions or concerns that you may have regarding the information given to you following your procedure. If we do not reach you, we will leave a message.  However, if you are feeling well and you are not experiencing any problems, there is no need to return our call.  We will assume that you have returned to your regular daily activities without incident.  If any biopsies were taken you will be contacted by phone or by letter within the next 1-3 weeks.  Please call us at [phone] if you have not heard about the biopsies in 3 weeks.    SIGNATURES/CONFIDENTIALITY: You and/or your care partner have signed paperwork which will be entered into your electronic medical record.  These signatures attest to the fact that that the information above on your After Visit Summary has been reviewed and is understood.  Full responsibility of the confidentiality of this discharge information lies with you and/or your care-partner. 

## 2016-09-29 NOTE — Progress Notes (Signed)
Report to PACU, RN, vss, BBS= Clear.  

## 2016-09-29 NOTE — Op Note (Signed)
[facility] Patient Name: Roy Barber Procedure Date: 09/29/2016 8:32 AM MRN: [id number] Endoscopist: Mallie Mussel L. Loletha Carrow , MD Age: 60 Referring MD:  Date of Birth: 08-14-1956 Gender: Male Account #: [id number] Procedure:                Colonoscopy Indications:              Surveillance: Personal history of adenomatous                            polyps on last colonoscopy >5 years ago (51mm TA,                            2/12) Medicines:                Monitored Anesthesia Care Procedure:                Pre-Anesthesia Assessment:                           - Prior to the procedure, a History and Physical                            was performed, and patient medications and                            allergies were reviewed. The patient's tolerance of                            previous anesthesia was also reviewed. The risks                            and benefits of the procedure and the sedation                            options and risks were discussed with the patient.                            All questions were answered, and informed consent                            was obtained. Prior Anticoagulants: The patient has                            taken no previous anticoagulant or antiplatelet                            agents. ASA Grade Assessment: II - A patient with                            mild systemic disease. After reviewing the risks                            and benefits, the patient was deemed in  satisfactory condition to undergo the procedure.                           After obtaining informed consent, the colonoscope                            was passed under direct vision. Throughout the                            procedure, the patient's blood pressure, pulse, and                            oxygen saturations were monitored continuously. The                            Model CF-HQ190L [id number]) scope was introduced                through the anus and advanced to the the cecum,                            identified by appendiceal orifice and ileocecal                            valve. The colonoscopy was performed without                            difficulty. The patient tolerated the procedure                            well. The quality of the bowel preparation was                            excellent. The ileocecal valve, appendiceal                            orifice, and rectum were photographed. The quality                            of the bowel preparation was evaluated using the                            BBPS [facility] Bowel Preparation Scale) with scores                            of: Right Colon = 3, Transverse Colon = 3 and Left                            Colon = 2. The total BBPS score equals 8. The bowel                            preparation used was SUPREP. Scope In: 8:37:36 AM Scope Out: 8:48:32 AM Scope Withdrawal Time: 0 hours 7 minutes 59 seconds  Total Procedure Duration: 0 hours 10 minutes 56 seconds  Findings:                 The digital rectal exam findings include enlarged                            prostate.                           A single diverticulum was found in the cecum.                           The exam was otherwise without abnormality on                            direct and retroflexion views. Complications:            No immediate complications. Estimated Blood Loss:     Estimated blood loss: none. Impression:               - Enlarged prostate found on digital rectal exam.                           - Diverticulosis in the cecum.                           - The examination was otherwise normal on direct                            and retroflexion views.                           - No specimens collected. Recommendation:           - Resume previous diet.                           - Continue present medications.                           - Repeat colonoscopy in 10  years for surveillance. Ephrem Carrick L. Loletha Carrow, MD 09/29/2016 8:54:16 AM This report has been signed electronically.

## 2016-09-30 ENCOUNTER — Telehealth: Payer: Self-pay

## 2016-09-30 NOTE — Telephone Encounter (Signed)
  Follow up Call-  Call back number 09/29/2016  Post procedure Call Back phone  # [phone]  Permission to leave phone message Yes  Some recent data might be hidden    Patient was called for follow up after his procedure on 09/29/2016. No answer at the number given for follow up phone call. I was not able to leave a message.

## 2016-09-30 NOTE — Telephone Encounter (Signed)
  Follow up Call-  Call back number 09/29/2016  Post procedure Call Back phone  # [phone]  Permission to leave phone message Yes  Some recent data might be hidden    Patient was called for follow up after his procedure on 09/29/2016. No answer at the number given for follow up phone call. A message was left on the answering machine.

## 2023-06-18 ENCOUNTER — Other Ambulatory Visit: Payer: Self-pay

## 2023-06-18 ENCOUNTER — Emergency Department (HOSPITAL_COMMUNITY)
Admission: EM | Admit: 2023-06-18 | Discharge: 2023-06-18 | Disposition: A | Discharge status: Home / Self Care | Payer: 59 | Attending: Emergency Medicine | Admitting: Emergency Medicine

## 2023-06-18 DIAGNOSIS — L209 Atopic dermatitis, unspecified: Secondary | ICD-10-CM | POA: Insufficient documentation

## 2023-06-18 DIAGNOSIS — Z79899 Other long term (current) drug therapy: Secondary | ICD-10-CM | POA: Insufficient documentation

## 2023-06-18 DIAGNOSIS — R21 Rash and other nonspecific skin eruption: Secondary | ICD-10-CM | POA: Diagnosis present

## 2023-06-18 DIAGNOSIS — I10 Essential (primary) hypertension: Secondary | ICD-10-CM | POA: Diagnosis not present

## 2023-06-18 MED ORDER — TRIAMCINOLONE ACETONIDE 0.1 % EX CREA: 1.0000 | TOPICAL_CREAM | Freq: Two times a day (BID) | CUTANEOUS | 0 refills | Status: AC

## 2023-06-18 NOTE — Discharge Instructions (Addendum)
Thank you for coming to [facility] Emergency Department. You were seen for rash. It is possible you have atopic dermatitis (eczema). Please use triamcinolone cream twice a day for 7 days.  You can also use an emollient such as Vaseline or Aquaphor on the area to help keep it moist.  Put this on after a shower bath. Please follow up with your primary care provider within 1 week.   Do not hesitate to return to the ED or call 911 if you experience: -Worsening symptoms -Skin peeling/sloughing -Shortness of breath or chest pain, wheezing -Lesions in your mouth -Lightheadedness, passing out -Fevers/chills -Anything else that concerns you

## 2023-06-18 NOTE — ED Provider Notes (Signed)
[facility] Provider Note   CSN: [id number] Arrival date & time: 06/18/23  0865     History  Chief Complaint  Patient presents with   Rash    Roy Barber is a 67 y.o. male with PMH as listed below who presents with rash on both legs and stomach for several weeks.  He did start metformin newly but it was after the rash started.  He has not had any lesions in his mouth, the palms of his hands or soles of his feet or his genitals.  He has not had any skin sloughing, raised areas.  It is not pruritic, but sometimes it burns.  He has never had this before.  No new soaps or detergents, no chest pain shortness of breath or wheezing.  Otherwise feels normal and has not had any fevers.  No history malignancy.  No bleeding.   Past Medical History:  Diagnosis Date   Hypertension    Muscle stiffness    Schizophrenia (HCC)        Home Medications Prior to Admission medications   Medication Sig Start Date End Date Taking? Authorizing Provider  triamcinolone cream (KENALOG) 0.1 % Apply 1 Application topically 2 (two) times daily for 7 days. 06/18/23 06/25/23 Yes Loetta Rough, MD  furosemide (LASIX) 40 MG tablet Take 40 mg by mouth daily.    [provider]  lisinopril (PRINIVIL,ZESTRIL) 10 MG tablet Take 10 mg by mouth daily.    [provider]  pravastatin (PRAVACHOL) 40 MG tablet Take 40 mg by mouth daily. 07/20/16   [provider]      Allergies    Patient has no known allergies.    Review of Systems   Review of Systems A 10 point review of systems was performed and is negative unless otherwise reported in HPI.  Physical Exam Updated Vital Signs BP (!) 179/93   Pulse (!) 102   Temp (!) 97.4 F (36.3 C)   Resp 18   Ht 6\' 2"  (1.88 m)   Wt 93 kg   BMI 26.32 kg/m  Physical Exam General: Normal appearing male, lying in bed.  HEENT: Sclera anicteric, MMM, trachea midline.  Cardiology: RRR, no  murmurs/rubs/gallops. BL radial and DP pulses equal bilaterally.  Resp: Normal respiratory rate and effort. CTAB, no wheezes, rhonchi, crackles.  Abd: Soft, non-tender, non-distended. No rebound tenderness or guarding.  GU: Deferred. MSK: No peripheral edema or signs of trauma. Extremities without deformity or TTP. No cyanosis or clubbing. Skin: warm, dry.  Not erythematous, flaky, dry diffuse rash over bilateral upper extremities and bilateral lower extremities.  No petechia or purpura.  No bruising, sloughing, peeling, bullae, discoloration, induration, fluctuance.  No crepitus to palpation. Neuro: A&Ox4, CNs II-XII grossly intact. MAEs. Sensation grossly intact.  Psych: Normal mood and affect.   R forearm:     L thigh   L lower leg:      ED Results / Procedures / Treatments   Labs (all labs ordered are listed, but only abnormal results are displayed) Labs Reviewed - No data to display  EKG None  Radiology No results found.  Procedures Procedures    Medications Ordered in ED Medications - No data to display  ED Course/ Medical Decision Making/ A&P                          Medical Decision Making Risk Prescription drug management.  This patient presents to the ED for concern of diffuse rash, this involves an extensive number of treatment options, and is a complaint that carries with it a high risk of complications and morbidity.  Patient is afebrile and extremely well-appearing.  MDM:    Patient is extremely well-appearing, non-toxic.  There is no clinical evidence of urticaria/anaphylaxis, SJS/TEN, pemphigus vulgaris/bullous pemphigoid, TSS, SSSS, dress syndrome.  No erythema, induration, fluctuance to indicate cellulitis or abscess.  No crepitus, discoloration, or bullae to indicate necrotizing fasciitis.  No purpura or petechia to indicate a thrombocytopenia or coagulopathy.  Patient's symptoms consistent with atopic dermatitis.  Rash does not appear  fungal in nature.  He does have some flaking but he does not have any plaques to indicate psoriasis.  Areas diffuse and unlikely to represent contact dermatitis, he does have some crusts and lichenification however it is not pruritic but it does burn him.  Less likely a drug rash as he started metformin afterward.  Most likely atopic dermatitis or other non-life threatening dermatitis. Patient be discharged with instructions for emollients at home such as Vaseline as well as a 7 days of low potency steroid triamcinolone cream and to follow-up with his PCP.  Discussed that he should follow-up with PCP with 1 week and if he does not improve he can be referred to a dermatologist.  Patient is given specific discharge instructions and return precautions the emergency department.  All questions answered to patient satisfaction.      Additional history obtained from daughter at bedside, chart reivew  Social Determinants of Health:  lives independently  Disposition:  DC w/ discharge instructions/return precautions. All questions answered to patient's satisfaction.    Co morbidities that complicate the patient evaluation  Past Medical History:  Diagnosis Date   Hypertension    Muscle stiffness    Schizophrenia (HCC)      Medicines Meds ordered this encounter  Medications   triamcinolone cream (KENALOG) 0.1 %    Sig: Apply 1 Application topically 2 (two) times daily for 7 days.    Dispense:  14 g    Refill:  0    I have reviewed the patients home medicines and have made adjustments as needed  Problem List / ED Course: Problem List Items Addressed This Visit   None Visit Diagnoses     Atopic dermatitis, unspecified type    -  Primary                   This note was created using dictation software, which may contain spelling or grammatical errors.    Loetta Rough, MD 06/18/23 [phone]

## 2023-06-18 NOTE — ED Triage Notes (Signed)
Pt. Stated, Roy Barber had a rash on both legs and my stomach for 2 weeks. The only medicine that has changed is a new diabetic medicine., I think Metformin
